# Patient Record
Sex: Female | Born: 1935 | Race: White | Hispanic: No | Marital: Married | Smoking: Former smoker
Health system: Southern US, Community
[De-identification: ages and names within clinical notes are randomized; demographics above are authoritative.]

## PROBLEM LIST (undated history)

## (undated) DIAGNOSIS — N189 Chronic kidney disease, unspecified: Secondary | ICD-10-CM

## (undated) DIAGNOSIS — K219 Gastro-esophageal reflux disease without esophagitis: Secondary | ICD-10-CM

## (undated) DIAGNOSIS — J449 Chronic obstructive pulmonary disease, unspecified: Secondary | ICD-10-CM

## (undated) DIAGNOSIS — D45 Polycythemia vera: Secondary | ICD-10-CM

## (undated) DIAGNOSIS — J45909 Unspecified asthma, uncomplicated: Secondary | ICD-10-CM

## (undated) DIAGNOSIS — I2699 Other pulmonary embolism without acute cor pulmonale: Secondary | ICD-10-CM

## (undated) DIAGNOSIS — J40 Bronchitis, not specified as acute or chronic: Secondary | ICD-10-CM

## (undated) DIAGNOSIS — M1990 Unspecified osteoarthritis, unspecified site: Secondary | ICD-10-CM

## (undated) DIAGNOSIS — C50919 Malignant neoplasm of unspecified site of unspecified female breast: Secondary | ICD-10-CM

## (undated) HISTORY — PX: BREAST LUMPECTOMY: SHX2

## (undated) HISTORY — PX: CHOLECYSTECTOMY: SHX55

## (undated) HISTORY — DX: Other pulmonary embolism without acute cor pulmonale: I26.99

## (undated) HISTORY — PX: TONSILLECTOMY: SUR1361

---

## 2004-05-24 ENCOUNTER — Ambulatory Visit: Payer: Self-pay | Admitting: Gastroenterology

## 2004-09-10 ENCOUNTER — Ambulatory Visit: Payer: Self-pay | Admitting: Gastroenterology

## 2006-07-02 ENCOUNTER — Ambulatory Visit: Payer: Self-pay | Admitting: Otolaryngology

## 2007-11-18 ENCOUNTER — Ambulatory Visit: Payer: Self-pay | Admitting: Gastroenterology

## 2015-06-24 ENCOUNTER — Other Ambulatory Visit: Payer: Self-pay

## 2015-06-24 ENCOUNTER — Emergency Department: Payer: Medicare (Managed Care)

## 2015-06-24 ENCOUNTER — Emergency Department
Admission: EM | Admit: 2015-06-24 | Discharge: 2015-06-24 | Disposition: A | Discharge status: Home / Self Care | Payer: Medicare (Managed Care) | Attending: Emergency Medicine | Admitting: Emergency Medicine

## 2015-06-24 ENCOUNTER — Encounter: Payer: Self-pay | Admitting: *Deleted

## 2015-06-24 DIAGNOSIS — J441 Chronic obstructive pulmonary disease with (acute) exacerbation: Secondary | ICD-10-CM

## 2015-06-24 DIAGNOSIS — Z87891 Personal history of nicotine dependence: Secondary | ICD-10-CM | POA: Diagnosis not present

## 2015-06-24 DIAGNOSIS — R05 Cough: Secondary | ICD-10-CM | POA: Diagnosis present

## 2015-06-24 DIAGNOSIS — Z880 Allergy status to penicillin: Secondary | ICD-10-CM | POA: Insufficient documentation

## 2015-06-24 HISTORY — DX: Malignant neoplasm of unspecified site of unspecified female breast: C50.919

## 2015-06-24 HISTORY — DX: Bronchitis, not specified as acute or chronic: J40

## 2015-06-24 HISTORY — DX: Unspecified asthma, uncomplicated: J45.909

## 2015-06-24 LAB — CBC WITH DIFFERENTIAL/PLATELET
BASOS ABS: 0.1 10*3/uL (ref 0–0.1)
BASOS PCT: 1 %
Eosinophils Absolute: 0.2 10*3/uL (ref 0–0.7)
Eosinophils Relative: 2 %
HEMATOCRIT: 45.7 % (ref 35.0–47.0)
HEMOGLOBIN: 14.8 g/dL (ref 12.0–16.0)
Lymphocytes Relative: 9 %
Lymphs Abs: 0.8 10*3/uL — ABNORMAL LOW (ref 1.0–3.6)
MCH: 27.9 pg (ref 26.0–34.0)
MCHC: 32.3 g/dL (ref 32.0–36.0)
MCV: 86.4 fL (ref 80.0–100.0)
MONOS PCT: 9 %
Monocytes Absolute: 0.8 10*3/uL (ref 0.2–0.9)
NEUTROS ABS: 6.7 10*3/uL — AB (ref 1.4–6.5)
NEUTROS PCT: 79 %
Platelets: 546 10*3/uL — ABNORMAL HIGH (ref 150–440)
RBC: 5.29 MIL/uL — ABNORMAL HIGH (ref 3.80–5.20)
RDW: 15.4 % — ABNORMAL HIGH (ref 11.5–14.5)
WBC: 8.5 10*3/uL (ref 3.6–11.0)

## 2015-06-24 LAB — BASIC METABOLIC PANEL
ANION GAP: 5 (ref 5–15)
BUN: 15 mg/dL (ref 6–20)
CO2: 28 mmol/L (ref 22–32)
Calcium: 9.8 mg/dL (ref 8.9–10.3)
Chloride: 108 mmol/L (ref 101–111)
Creatinine, Ser: 1.1 mg/dL — ABNORMAL HIGH (ref 0.44–1.00)
GFR calc Af Amer: 51 mL/min — ABNORMAL LOW (ref >60)
GFR, EST NON AFRICAN AMERICAN: 44 mL/min — AB (ref >60)
GLUCOSE: 101 mg/dL — AB (ref 65–99)
POTASSIUM: 4.2 mmol/L (ref 3.5–5.1)
Sodium: 141 mmol/L (ref 135–145)

## 2015-06-24 LAB — TROPONIN I: Troponin I: <0.03 ng/mL (ref <0.031)

## 2015-06-24 MED ORDER — ALBUTEROL SULFATE (2.5 MG/3ML) 0.083% IN NEBU
5.0000 mg | INHALATION_SOLUTION | Freq: Once | RESPIRATORY_TRACT | Status: AC
  Administered 2015-06-24: 5 mg via RESPIRATORY_TRACT
  Filled 2015-06-24: qty 6

## 2015-06-24 MED ORDER — PREDNISONE 10 MG PO TABS: ORAL_TABLET | ORAL | Status: DC

## 2015-06-24 MED ORDER — PREDNISONE 20 MG PO TABS
60.0000 mg | ORAL_TABLET | Freq: Once | ORAL | Status: AC
  Administered 2015-06-24: 60 mg via ORAL
  Filled 2015-06-24: qty 3

## 2015-06-24 MED ORDER — IPRATROPIUM-ALBUTEROL 0.5-2.5 (3) MG/3ML IN SOLN
3.0000 mL | Freq: Once | RESPIRATORY_TRACT | Status: AC
  Administered 2015-06-24: 3 mL via RESPIRATORY_TRACT
  Filled 2015-06-24: qty 3

## 2015-06-24 NOTE — ED Notes (Signed)
Pt ambulated with 02 stat monitored. Pt maintained an 02 stat between 90-94%.

## 2015-06-24 NOTE — ED Provider Notes (Signed)
[facility] Emergency Department Provider Note   ____________________________________________  Time seen:  I have reviewed the triage vital signs and the triage nursing note.  HISTORY  Chief Complaint Cough   Historian Patient, spouse and son  HPI Summer Green is a 81 y.o. female with history of asthma/COPD who uses albuterol and a nebulizer treatment at home in [place], and she is here visiting family for the Christmas holiday. She started having trouble breathing about 2 days ago, it was worse all night long.  She reports no history of hospitalization for COPD or wheezing. No fevers. No productive cough. Chest pressure but no chest pain. She doesn't wear home oxygen. Symptoms are moderate.    Past Medical History  Diagnosis Date  . Asthma   . Bronchitis   . Breast cancer ([place])     right side    There are no active problems to display for this patient.   Past Surgical History  Procedure Laterality Date  . Breast lumpectomy      right side    Current Outpatient Rx  Name  Route  Sig  Dispense  Refill  . predniSONE (DELTASONE) 10 MG tablet      50mg  daily for 4 more days   20 tablet   0     Allergies Amoxicillin; Codeine; Iodine; Morphine and related; and Shellfish allergy  History reviewed. No pertinent family history.  Social History Social History  Substance Use Topics  . Smoking status: Former Research scientist (life sciences)  . Smokeless tobacco: None  . Alcohol Use: No    Review of Systems  Constitutional: Negative for fever. Eyes: Negative for visual changes. ENT: Negative for sore throat. Cardiovascular: Negative for chest pain. Respiratory: positivefor shortness of breath. Gastrointestinal: Negative for abdominal pain, vomiting and diarrhea. Genitourinary: Negative for dysuria. Musculoskeletal: Negative for back pain. Skin: Negative for rash. Neurological: Negative for headache. 10 point Review of Systems otherwise  negative ____________________________________________   PHYSICAL EXAM:  VITAL SIGNS: ED Triage Vitals  Enc Vitals Group     BP 06/24/15 1611 154/90 mmHg     Pulse Rate 06/24/15 1611 90     Resp 06/24/15 1611 18     Temp 06/24/15 1611 98.3 F (36.8 C)     Temp Source 06/24/15 1611 Oral     SpO2 06/24/15 1611 96 %     Weight 06/24/15 1611 185 lb (83.915 kg)     Height 06/24/15 1611 5' 8.5" (1.74 m)     Head Cir --      Peak Flow --      Pain Score --      Pain Loc --      Pain Edu? --      Excl. in [place]? --      Constitutional: Alert and oriented. Well appearingoverall but actively wheezing.. Eyes: Conjunctivae are normal. PERRL. Normal extraocular movements. ENT   Head: Normocephalic and atraumatic.   Nose: No congestion/rhinnorhea.   Mouth/Throat: Mucous membranes are moist.   Neck: No stridor. Cardiovascular/Chest: Normal rate, regular rhythm.  No murmurs, rubs, or gallops. Respiratory: active wheezing in all lung fields with decreased air movement throughout. No rhonchi. Positive tachypnea. No retractions. Gastrointestinal: Soft. No distention, no guarding, no rebound. Nontender   Genitourinary/rectal:Deferred Musculoskeletal: Nontender with normal range of motion in all extremities. No joint effusions.  No lower extremity tenderness.  No edema. Neurologic:  Normal speech and language. No gross or focal neurologic deficits are appreciated. Skin:  Skin is warm, dry and  intact. No rash noted. Psychiatric: Mood and affect are normal. Speech and behavior are normal. Patient exhibits appropriate insight and judgment.  ____________________________________________   EKG I, Lisa Roca, MD, the attending physician have personally viewed and interpreted all ECGs.  94 bpm. Narrow QRS. Left axis deviation. Nonspecific ST segment ____________________________________________  LABS (pertinent positives/negatives)  Basic metabolic panel without significant  abnormality. Creatinine 1.10 Troponin less than 0.03 White blood count 8.5, hemoglobin14.8 and platelet count 546  ____________________________________________  RADIOLOGY All Xrays were viewed by me. Imaging interpreted by Radiologist.  Chest x-ray two-view: Hyperinflated lungs with chronic bronchitic markings. No acute findings. __________________________________________  PROCEDURES  Procedure(s) performed: None  Critical Care performed: None  ____________________________________________   ED COURSE / ASSESSMENT AND PLAN  CONSULTATIONS: None  Pertinent labs & imaging results that were available during my care of the patient were reviewed by me and considered in my medical decision making (see chart for details).   Patient in mild respiratory distress due to wheezing. Patient received initially 1 DuoNeb, followed by 2 to. After this her lungs were clear and without wheezing. She felt much better. She was given first dose of prednisone 60 mg here. Patient was walked after breathing treatments and her oxygen saturation remained above 90-94% on room air and she was able to walk tolerating this well. I discussed with her abdomen to add a burst treatment of prednisone for 4 more days, and she has her albuterol inhaler and we discussed how to use it.  Heart rate at rest around 90, and when she stands and walks around 110. She's a symptomatic with this, and I suspected due to the albuterol. I discussed this with the patient, and don't think this is any indication of worsening condition.  Patient / Family / Caregiver informed of clinical course, medical decision-making process, and agree with plan.   I discussed return precautions, follow-up instructions, and discharged instructions with patient and/or family.  ___________________________________________   FINAL CLINICAL IMPRESSION(S) / ED DIAGNOSES   Final diagnoses:  COPD exacerbation ([place])       Lisa Roca,  MD 06/24/15 2107

## 2015-06-24 NOTE — ED Notes (Signed)
Patient is resting comfortably. 

## 2015-06-24 NOTE — Discharge Instructions (Signed)
You were treated for wheezing and COPD exacerbation. You're being started on prednisone for 4 more days.  Use her inhaler 2 puffs every 4 hours while awake for the next 2 days during your wheezing exacerbation. Return to the emergency department any worsening condition including trouble breathing, wheezing, chest pain, altered mental status, or fever. Return for any symptoms that are concerning to you.   Chronic Obstructive Pulmonary Disease Exacerbation Chronic obstructive pulmonary disease (COPD) is a common lung problem. In COPD, the flow of air from the lungs is limited. COPD exacerbations are times that breathing gets worse and you need extra treatment. Without treatment they can be life threatening. If they happen often, your lungs can become more damaged. If your COPD gets worse, your doctor may treat you with:  Medicines.  Oxygen.  Different ways to clear your airway, such as using a mask. HOME CARE  Do not smoke.  Avoid tobacco smoke and other things that bother your lungs.  If given, take your antibiotic medicine as told. Finish the medicine even if you start to feel better.  Only take medicines as told by your doctor.  Drink enough fluids to keep your pee (urine) clear or pale yellow (unless your doctor has told you not to).  Use a cool mist machine (vaporizer).  If you use oxygen or a machine that turns liquid medicine into a mist (nebulizer), continue to use them as told.  Keep up with shots (vaccinations) as told by your doctor.  Exercise regularly.  Eat healthy foods.  Keep all doctor visits as told. GET HELP RIGHT AWAY IF:  You are very short of breath and it gets worse.  You have trouble talking.  You have bad chest pain.  You have blood in your spit (sputum).  You have a fever.  You keep throwing up (vomiting).  You feel weak, or you pass out (faint).  You feel confused.  You keep getting worse. MAKE SURE YOU:  Understand these  instructions.  Will watch your condition.  Will get help right away if you are not doing well or get worse.   This information is not intended to replace advice given to you by your health care provider. Make sure you discuss any questions you have with your health care provider.   Document Released: 06/05/2011 Document Revised: 07/07/2014 Document Reviewed: 02/18/2013 Elsevier Interactive Patient Education [employer].

## 2015-06-24 NOTE — ED Notes (Signed)
Pt discharged to home with family.  Discharged teaching done.  Pt voiced understanding.  No questions or concerns at this time.  Teach back verified.  Pt in NAD.  No items left in ED.

## 2015-06-24 NOTE — ED Notes (Signed)
Pt reports having SOB and cough for the past couple days but coughing became worse last night. PT arrives to ED SOB with exp wheezing.

## 2020-09-04 NOTE — Progress Notes (Signed)
[facility]  [address] [place], [place] [address] Phone: [phone]  Fax: [phone]   Clinic Day:  09/05/2020  Referring physician: Ginette Otto Malena Edman*  Chief Complaint: Summer Green is a 87 y.o. female with polycythemia rubra vera (PV) who is referred in consultation by Tillman Sers, NP for assessment and management.   HPI:  The patient was diagnosed with polycythemia rubra vera in 2018.  JAK2 mutation assay was positive for V617F mutation on 09/15/2016.  Bone marrow on 11/03/2016 showed first regular bone marrow with panmyelosis and some lymphoid aggregates. Special stains were not diagnostic of lymphoproliferative disease morphologically. Flow cytometry showed small population (1.2%) of monoclonal B cells.   She undergoes phlebotomies every 1-2 weeks as needed to maintain a hematocrit < 45. Her last phlebotomy was about 6 months ago. She was started on hydroxyurea in 2018; she currently takes 500 mg every Monday, Tuesday, Thursday, and Friday. She used to take it everyday.  The patient is moving to the area from [place] and was referred for continuing care.  Labs followed: 11/23/2018:  Hematocrit was 43.0, hemoglobin 13,7, MCV 93.9, platelets 484,000, WBC 7,000. 02/15/2019:  Hematocrit was 42.7, hemoglobin 13.3, MCV 98.2, platelets 448,000, WBC 6,260. 05/10/2019:  Hematocrit was 42.4, hemoglobin 13.5, MCV 94.8, platelets 459,000, WBC 6,280. 08/10/2019:  Hematocrit was 42.2, hemoglobin 13.5, MCV 96.1, platelets 485,000, WBC 5,990. 11/16/2019:  Hematocrit was 41.1, hemoglobin 12.0, MCV 97.6, platelets 430,000, WBC 6,420.  She was diagnosed with stage IA right breast cancer in 10/2010 (ER+, PR-, HER2-). She underwent right lumpectomy and lymph node biopsy on 11/08/2010. She completed radiation in 01/2011. She received anastrozole from 01/2011 - 07/2016. Diagnostic mammogram on 04/19/2020 showed a subcentimeter benign-appearing intramammary  lymph node at the left breast, 2:00 position, 12 cm from the nipple. There was no evidence of malignancy.  She had a pulmonary embolism in 07/2016. She was treated with IV heparin x 3 days. Factor V Leiden and prothrombin gene mutation were negative on 09/12/2016.  She was on Pradaxa 150 mg BID (08/20/2016 - 02/20/2017) then Xarelto (02/21/2017 - 07/2017). Xarelto was discontinued due to a diverticular bleed. It was felt that her PE may have been provoked by anastrozole.  The patient has osteopenia. She has stage 3 chronic kidney disease.  Symptomatically, she has been good. She has lost 15-20 lbs in the past 2 years. She ate less due to the pandemic. She has shortness of breath due to allergies. She has occasional constipation. Her skin is dry. She sometimes has numbness in her fingers and toes. She has a cough.  The patient denies fevers, sweats, headaches, changes in vision, runny nose, sore throat, chest pain, palpitations, nausea, vomiting, diarrhea, reflux, urinary symptoms, bone or joint symptoms, skin changes, numbness, weakness, balance or coordination problem, and bleeding of any kind.  She takes baby aspirin daily.   Past Medical History:  Diagnosis Date  . Asthma   . Breast cancer ([place])    right side  . Bronchitis   . Pulmonary embolism [facility])     Past Surgical History:  Procedure Laterality Date  . BREAST LUMPECTOMY     right side  . CHOLECYSTECTOMY    . TONSILLECTOMY      Family History  Problem Relation Age of Onset  . Diabetes Mother   . Heart disease Father     Social History:  reports that she has quit smoking. She has never used smokeless tobacco. She reports that she does not drink  alcohol and does not use drugs. She quit smoking 50+ years ago. She smoked 1 pack per day. She does not drink alcohol. She is retired and used to run a business. She previously lived in [place] and has been in [place] since 05/2020. She and her husband live with her son and daughter in  law. Her daughter in law is Santiago Glad. The patient is accompanied by Santiago Glad today.  Allergies:  Allergies  Allergen Reactions  . Amoxicillin   . Codeine   . Iodine   . Morphine And Related   . Shellfish Allergy     Current Medications: Current Outpatient Medications  Medication Sig Dispense Refill  . albuterol (VENTOLIN HFA) 108 (90 Base) MCG/ACT inhaler Inhale into the lungs.    Marland Kitchen aspirin 81 MG EC tablet Take by mouth.    . cyanocobalamin 1000 MCG tablet Take by mouth.    . hydroxyurea (HYDREA) 500 MG capsule Take by mouth.    . hydroxyurea (HYDREA) 500 MG capsule Take 1 tablet by mouth on Monday, Tuesday, Thursday, Friday every week. May take with food to minimize GI side effects. 50 capsule 0  . ipratropium-albuterol (DUONEB) 0.5-2.5 (3) MG/3ML SOLN Inhale into the lungs.    . Magnesium 250 MG TABS Take by mouth.    . OMEPRAZOLE PO Take by mouth.     No current facility-administered medications for this visit.    Review of Systems  Constitutional: Negative for chills, diaphoresis, fever, malaise/fatigue and weight loss.  HENT: Positive for hearing loss. Negative for congestion, ear discharge, ear pain, nosebleeds, sinus pain, sore throat and tinnitus.   Eyes: Negative for blurred vision.  Respiratory: Positive for cough and shortness of breath (due to allergies). Negative for hemoptysis and sputum production.   Cardiovascular: Negative for chest pain, palpitations and leg swelling.  Gastrointestinal: Positive for constipation (occasional). Negative for abdominal pain, blood in stool, diarrhea, heartburn, melena, nausea and vomiting.  Genitourinary: Negative for dysuria, frequency, hematuria and urgency.  Musculoskeletal: Negative for back pain, joint pain, myalgias and neck pain.  Skin: Negative for itching and rash.       Dry skin  Neurological: Positive for sensory change (numbness in fingers and toes sometimes). Negative for dizziness, tingling, weakness and headaches.   Endo/Heme/Allergies: Bruises/bleeds easily.  Psychiatric/Behavioral: Negative for depression and memory loss. The patient is not nervous/anxious and does not have insomnia.   All other systems reviewed and are negative.  Performance status (ECOG): 1  Vitals Blood pressure 129/75, pulse 80, temperature (!) 97.1 F (36.2 C), temperature source Tympanic, resp. rate 18, weight 156 lb 8.4 oz (71 kg), SpO2 98 %.   Physical Exam Vitals and nursing note reviewed.  Constitutional:      General: She is not in acute distress.    Appearance: She is not diaphoretic.  HENT:     Head: Normocephalic and atraumatic.     Comments: Styled white hair.    Mouth/Throat:     Mouth: Mucous membranes are moist.     Pharynx: Oropharynx is clear.  Eyes:     General: No scleral icterus.    Extraocular Movements: Extraocular movements intact.     Conjunctiva/sclera: Conjunctivae normal.     Pupils: Pupils are equal, round, and reactive to light.  Cardiovascular:     Rate and Rhythm: Normal rate and regular rhythm.     Heart sounds: Normal heart sounds. No murmur heard.   Pulmonary:     Effort: Pulmonary effort is normal. No respiratory distress.  Breath sounds: Normal breath sounds. No wheezing or rales.  Chest:     Chest wall: No tenderness.  Breasts:     Right: No axillary adenopathy or supraclavicular adenopathy.     Left: No axillary adenopathy or supraclavicular adenopathy.    Abdominal:     General: Bowel sounds are normal. There is no distension.     Palpations: Abdomen is soft. There is no hepatomegaly, splenomegaly or mass.     Tenderness: There is no abdominal tenderness. There is no guarding or rebound.  Musculoskeletal:        General: No swelling or tenderness. Normal range of motion.     Cervical back: Normal range of motion and neck supple.  Lymphadenopathy:     Head:     Right side of head: No preauricular, posterior auricular or occipital adenopathy.     Left side of head:  No preauricular, posterior auricular or occipital adenopathy.     Cervical: No cervical adenopathy.     Upper Body:     Right upper body: No supraclavicular or axillary adenopathy.     Left upper body: No supraclavicular or axillary adenopathy.     Lower Body: No right inguinal adenopathy. No left inguinal adenopathy.  Skin:    General: Skin is warm and dry.  Neurological:     Mental Status: She is alert and oriented to person, place, and time.  Psychiatric:        Behavior: Behavior normal.        Thought Content: Thought content normal.        Judgment: Judgment normal.    No visits with results within 3 Day(s) from this visit.  Latest known visit with results is:  Admission on 06/24/2015, Discharged on 06/24/2015  Component Date Value Ref Range Status  . Sodium 06/24/2015 141  135 - 145 mmol/L Final  . Potassium 06/24/2015 4.2  3.5 - 5.1 mmol/L Final  . Chloride 06/24/2015 108  101 - 111 mmol/L Final  . CO2 06/24/2015 28  22 - 32 mmol/L Final  . Glucose, Bld 06/24/2015 101* 65 - 99 mg/dL Final  . BUN 06/24/2015 15  6 - 20 mg/dL Final  . Creatinine, Ser 06/24/2015 1.10* 0.44 - 1.00 mg/dL Final  . Calcium 06/24/2015 9.8  8.9 - 10.3 mg/dL Final  . GFR calc non Af Amer 06/24/2015 44* >60 mL/min Final  . GFR calc Af Amer 06/24/2015 51* >60 mL/min Final   Comment: (NOTE) The eGFR has been calculated using the CKD EPI equation. This calculation has not been validated in all clinical situations. eGFR's persistently <60 mL/min signify possible Chronic Kidney Disease.   . Anion gap 06/24/2015 5  5 - 15 Final  . Troponin I 06/24/2015 <0.03  <0.031 ng/mL Final   Comment:        NO INDICATION OF MYOCARDIAL INJURY.   . WBC 06/24/2015 8.5  3.6 - 11.0 K/uL Final  . RBC 06/24/2015 5.29* 3.80 - 5.20 MIL/uL Final  . Hemoglobin 06/24/2015 14.8  12.0 - 16.0 g/dL Final  . HCT 06/24/2015 45.7  35.0 - 47.0 % Final  . MCV 06/24/2015 86.4  80.0 - 100.0 fL Final  . MCH 06/24/2015 27.9  26.0 -  34.0 pg Final  . MCHC 06/24/2015 32.3  32.0 - 36.0 g/dL Final  . RDW 06/24/2015 15.4* 11.5 - 14.5 % Final  . Platelets 06/24/2015 546* 150 - 440 K/uL Final  . Neutrophils Relative % 06/24/2015 79  % Final  .  Neutro Abs 06/24/2015 6.7* 1.4 - 6.5 K/uL Final  . Lymphocytes Relative 06/24/2015 9  % Final  . Lymphs Abs 06/24/2015 0.8* 1.0 - 3.6 K/uL Final  . Monocytes Relative 06/24/2015 9  % Final  . Monocytes Absolute 06/24/2015 0.8  0.2 - 0.9 K/uL Final  . Eosinophils Relative 06/24/2015 2  % Final  . Eosinophils Absolute 06/24/2015 0.2  0 - 0.7 K/uL Final  . Basophils Relative 06/24/2015 1  % Final  . Basophils Absolute 06/24/2015 0.1  0 - 0.1 K/uL Final    Assessment:  Summer Green is a 87 y.o. female with polycythemia rubra vera.  JAK2 revealed V617F mutation on 09/15/2016. Factor V Leiden and prothrombin gene mutation were negative on 09/12/2016. Bone marrow on 11/03/2016 showed first regular bone marrow with panmyelosis and some lymphoid aggregates. Special stains were not diagnostic of lymphoproliferative disease morphologically. Flow cytometry showed small population (1.2%) of monoclonal B cells.   She is on a phlebotomy program. Hematocrit goal is < 45. She began hydroxyurea in 2018.  Current dose is 500 mg every Monday, Tuesday, Thursday, and Friday.   She has a history of stage IA right breast cancer in 10/2010 (ER+, PR-, HER2-). She underwent right lumpectomy and lymph node biopsy on 11/08/2010. She complete radiation in 01/2011. She received anastrozole from 01/2011 - 07/2016. Diagnostic mammogram on 04/19/2020 showed a subcentimeter benign-appearing intramammary lymph node at the left breast, 2:00 position, 12 cm from the nipple. There was no evidence of malignancy.  She had a pulmonary embolism in 07/2016 felt secondary to anastrazole.  Factor V Leiden and prothrombin gene mutation were negative on 09/12/2016.  She was on Pradaxa (08/20/2016 - 02/20/2017) then Xarelto (02/21/2017  - 07/2017). Xarelto was discontinued due to a diverticular bleed.  She has osteopenia. She has stage 3 chronic kidney disease.  Symptomatically, she feels good. She has lost 15-20 lbs in the past 2 years due to the pandemic.  Exam reveals no adenopathy or hepatosplenomegaly.  Hematocrit is 39.6, hemoglobin 12.8, platelets 444,000, WBC 5100 with an ANC of 3600.  Plan: 1.   Labs today:  CBC with diff, CMP, ferritin, iron studies. 2.   Polycythemia rubra vera  Review entire medical history, diagnoses and management of PV.   She is currently on hydroxyurea 500 mg every Monday, Tuesday, Thursday and Friday.  Hematocrit goal is<= 42.  Platelet goal is < 400,000.  She is on a baby aspirin. 3.   Stage IA right breast cancer  Review entire medical history, diagnoses and management of stage I ER+ breast cancer.  Continue yearly surveillance in 03/2021. 4.   History of pulmonary embolism  Pulmonary embolism felt secondary to anastrozole.  She was on Pradaxa then Xarelto.  Anticoagulation was discontinued in 07/2017 secondary to a diverticular bleed. 5.   RTC in 3 months for MD assessment and labs (CBC with diff, CMP).  I discussed the assessment and treatment plan with the patient.  The patient was provided an opportunity to ask questions and all were answered.  The patient agreed with the plan and demonstrated an understanding of the instructions.  The patient was advised to call back if the symptoms worsen or if the condition fails to improve as anticipated.  I provided 28 minutes of face-to-face time during this this encounter and > 50% was spent counseling as documented under my assessment and plan. An additional 20 minutes were spent reviewing her chart (Epic and Care Everywhere) including notes, labs, and imaging studies.  Summer Green C. Mike Gip, MD, PhD    09/05/2020, 3:45 PM  I, Mirian Mo Tufford, am acting as Education administrator for [employer]. Mike Gip, MD, PhD.  I, Kavaughn Faucett C. Mike Gip, MD, have reviewed  the above documentation for accuracy and completeness, and I agree with the above.

## 2020-09-05 ENCOUNTER — Other Ambulatory Visit: Payer: Self-pay

## 2020-09-05 ENCOUNTER — Encounter: Payer: Self-pay | Admitting: Hematology and Oncology

## 2020-09-05 ENCOUNTER — Inpatient Hospital Stay: Payer: Medicare Other

## 2020-09-05 ENCOUNTER — Inpatient Hospital Stay: Payer: Medicare Other | Attending: Hematology and Oncology | Admitting: Hematology and Oncology

## 2020-09-05 VITALS — BP 129/75 | HR 80 | Temp 97.1°F | Resp 18 | Wt 156.5 lb

## 2020-09-05 DIAGNOSIS — D45 Polycythemia vera: Secondary | ICD-10-CM | POA: Insufficient documentation

## 2020-09-05 DIAGNOSIS — N183 Chronic kidney disease, stage 3 unspecified: Secondary | ICD-10-CM | POA: Diagnosis not present

## 2020-09-05 DIAGNOSIS — Z79811 Long term (current) use of aromatase inhibitors: Secondary | ICD-10-CM

## 2020-09-05 DIAGNOSIS — M8580 Other specified disorders of bone density and structure, unspecified site: Secondary | ICD-10-CM | POA: Insufficient documentation

## 2020-09-05 DIAGNOSIS — Z7982 Long term (current) use of aspirin: Secondary | ICD-10-CM | POA: Diagnosis not present

## 2020-09-05 DIAGNOSIS — Z853 Personal history of malignant neoplasm of breast: Secondary | ICD-10-CM | POA: Insufficient documentation

## 2020-09-05 LAB — COMPREHENSIVE METABOLIC PANEL
ALT: 14 U/L (ref 0–44)
AST: 20 U/L (ref 15–41)
Albumin: 3.8 g/dL (ref 3.5–5.0)
Alkaline Phosphatase: 81 U/L (ref 38–126)
Anion gap: 9 (ref 5–15)
BUN: 23 mg/dL (ref 8–23)
CO2: 26 mmol/L (ref 22–32)
Calcium: 9.9 mg/dL (ref 8.9–10.3)
Chloride: 105 mmol/L (ref 98–111)
Creatinine, Ser: 1.2 mg/dL — ABNORMAL HIGH (ref 0.44–1.00)
GFR, Estimated: 43 mL/min — ABNORMAL LOW (ref >60)
Glucose, Bld: 137 mg/dL — ABNORMAL HIGH (ref 70–99)
Potassium: 4.2 mmol/L (ref 3.5–5.1)
Sodium: 140 mmol/L (ref 135–145)
Total Bilirubin: 0.4 mg/dL (ref 0.3–1.2)
Total Protein: 6.6 g/dL (ref 6.5–8.1)

## 2020-09-05 LAB — CBC WITH DIFFERENTIAL/PLATELET
Abs Immature Granulocytes: 0.01 10*3/uL (ref 0.00–0.07)
Basophils Absolute: 0 10*3/uL (ref 0.0–0.1)
Basophils Relative: 1 %
Eosinophils Absolute: 0.1 10*3/uL (ref 0.0–0.5)
Eosinophils Relative: 3 %
HCT: 39.6 % (ref 36.0–46.0)
Hemoglobin: 12.8 g/dL (ref 12.0–15.0)
Immature Granulocytes: 0 %
Lymphocytes Relative: 20 %
Lymphs Abs: 1 10*3/uL (ref 0.7–4.0)
MCH: 31.8 pg (ref 26.0–34.0)
MCHC: 32.3 g/dL (ref 30.0–36.0)
MCV: 98.3 fL (ref 80.0–100.0)
Monocytes Absolute: 0.3 10*3/uL (ref 0.1–1.0)
Monocytes Relative: 7 %
Neutro Abs: 3.6 10*3/uL (ref 1.7–7.7)
Neutrophils Relative %: 69 %
Platelets: 444 10*3/uL — ABNORMAL HIGH (ref 150–400)
RBC: 4.03 MIL/uL (ref 3.87–5.11)
RDW: 14.4 % (ref 11.5–15.5)
WBC: 5.1 10*3/uL (ref 4.0–10.5)
nRBC: 0 % (ref 0.0–0.2)

## 2020-09-05 LAB — FERRITIN: Ferritin: 43 ng/mL (ref 11–307)

## 2020-09-05 LAB — IRON AND TIBC
Iron: 51 ug/dL (ref 28–170)
Saturation Ratios: 17 % (ref 10.4–31.8)
TIBC: 307 ug/dL (ref 250–450)
UIBC: 256 ug/dL

## 2020-09-05 MED ORDER — HYDROXYUREA 500 MG PO CAPS: ORAL_CAPSULE | ORAL | 0 refills | Status: DC

## 2020-09-05 NOTE — Progress Notes (Signed)
Patient reports chronic cough, asthma, numbness and tingling in fingers and toes

## 2020-09-06 ENCOUNTER — Telehealth: Payer: Self-pay

## 2020-09-06 NOTE — Telephone Encounter (Signed)
-----   Message from Lequita Asal, MD sent at 09/06/2020  1:18 PM EST ----- Regarding: Please call the patient's daughter  Baxter Flattery,   Review CBC.  Platelet count > 400,000.  Let's check a CBC with diff in 6 weeks.  If platelet count remains > 400,000 in 6 weeks, we will adjust hydroxyurea at that time.   M   ----- Message ----- From: Buel Ream, Lab In [place] Sent: 09/05/2020   3:55 PM EST To: Lequita Asal, MD

## 2020-10-04 DIAGNOSIS — Z853 Personal history of malignant neoplasm of breast: Secondary | ICD-10-CM | POA: Insufficient documentation

## 2020-10-04 DIAGNOSIS — E538 Deficiency of other specified B group vitamins: Secondary | ICD-10-CM | POA: Insufficient documentation

## 2020-10-04 DIAGNOSIS — M8580 Other specified disorders of bone density and structure, unspecified site: Secondary | ICD-10-CM | POA: Insufficient documentation

## 2020-10-04 DIAGNOSIS — N183 Chronic kidney disease, stage 3 unspecified: Secondary | ICD-10-CM | POA: Insufficient documentation

## 2020-10-04 DIAGNOSIS — Z91018 Allergy to other foods: Secondary | ICD-10-CM | POA: Insufficient documentation

## 2020-10-04 DIAGNOSIS — J302 Other seasonal allergic rhinitis: Secondary | ICD-10-CM | POA: Insufficient documentation

## 2020-10-04 DIAGNOSIS — K219 Gastro-esophageal reflux disease without esophagitis: Secondary | ICD-10-CM | POA: Insufficient documentation

## 2020-10-04 DIAGNOSIS — E559 Vitamin D deficiency, unspecified: Secondary | ICD-10-CM | POA: Insufficient documentation

## 2020-10-04 DIAGNOSIS — J4540 Moderate persistent asthma, uncomplicated: Secondary | ICD-10-CM | POA: Insufficient documentation

## 2020-10-18 ENCOUNTER — Other Ambulatory Visit: Payer: Self-pay

## 2020-10-18 ENCOUNTER — Inpatient Hospital Stay: Payer: Medicare Other

## 2020-10-18 ENCOUNTER — Inpatient Hospital Stay: Payer: Medicare Other | Attending: Hematology and Oncology

## 2020-10-18 DIAGNOSIS — D45 Polycythemia vera: Secondary | ICD-10-CM | POA: Insufficient documentation

## 2020-10-18 LAB — CBC WITH DIFFERENTIAL/PLATELET
Abs Immature Granulocytes: 0.01 10*3/uL (ref 0.00–0.07)
Basophils Absolute: 0.1 10*3/uL (ref 0.0–0.1)
Basophils Relative: 1 %
Eosinophils Absolute: 0.2 10*3/uL (ref 0.0–0.5)
Eosinophils Relative: 3 %
HCT: 38.8 % (ref 36.0–46.0)
Hemoglobin: 12.3 g/dL (ref 12.0–15.0)
Immature Granulocytes: 0 %
Lymphocytes Relative: 23 %
Lymphs Abs: 1.2 10*3/uL (ref 0.7–4.0)
MCH: 31.2 pg (ref 26.0–34.0)
MCHC: 31.7 g/dL (ref 30.0–36.0)
MCV: 98.5 fL (ref 80.0–100.0)
Monocytes Absolute: 0.4 10*3/uL (ref 0.1–1.0)
Monocytes Relative: 7 %
Neutro Abs: 3.4 10*3/uL (ref 1.7–7.7)
Neutrophils Relative %: 66 %
Platelets: 447 10*3/uL — ABNORMAL HIGH (ref 150–400)
RBC: 3.94 MIL/uL (ref 3.87–5.11)
RDW: 14.5 % (ref 11.5–15.5)
WBC: 5.1 10*3/uL (ref 4.0–10.5)
nRBC: 0 % (ref 0.0–0.2)

## 2020-10-21 ENCOUNTER — Other Ambulatory Visit: Payer: Self-pay

## 2020-10-21 ENCOUNTER — Encounter: Payer: Self-pay | Admitting: Emergency Medicine

## 2020-10-21 ENCOUNTER — Ambulatory Visit (INDEPENDENT_AMBULATORY_CARE_PROVIDER_SITE_OTHER): Payer: Medicare Other

## 2020-10-21 ENCOUNTER — Ambulatory Visit
Admission: EM | Admit: 2020-10-21 | Discharge: 2020-10-21 | Disposition: A | Discharge status: Home / Self Care | Payer: Medicare Other | Attending: Emergency Medicine | Admitting: Emergency Medicine

## 2020-10-21 DIAGNOSIS — R519 Headache, unspecified: Secondary | ICD-10-CM | POA: Diagnosis not present

## 2020-10-21 DIAGNOSIS — Z880 Allergy status to penicillin: Secondary | ICD-10-CM | POA: Diagnosis not present

## 2020-10-21 DIAGNOSIS — R0981 Nasal congestion: Secondary | ICD-10-CM | POA: Diagnosis not present

## 2020-10-21 DIAGNOSIS — R509 Fever, unspecified: Secondary | ICD-10-CM

## 2020-10-21 DIAGNOSIS — R059 Cough, unspecified: Secondary | ICD-10-CM | POA: Diagnosis present

## 2020-10-21 DIAGNOSIS — Z20822 Contact with and (suspected) exposure to covid-19: Secondary | ICD-10-CM | POA: Diagnosis not present

## 2020-10-21 DIAGNOSIS — Z87891 Personal history of nicotine dependence: Secondary | ICD-10-CM | POA: Diagnosis not present

## 2020-10-21 DIAGNOSIS — J069 Acute upper respiratory infection, unspecified: Secondary | ICD-10-CM | POA: Insufficient documentation

## 2020-10-21 MED ORDER — BENZONATATE 100 MG PO CAPS: 200.0000 mg | ORAL_CAPSULE | Freq: Three times a day (TID) | ORAL | 0 refills | Status: DC

## 2020-10-21 MED ORDER — MOLNUPIRAVIR EUA 200MG CAPSULE: 4.0000 | ORAL_CAPSULE | Freq: Two times a day (BID) | ORAL | 0 refills | Status: AC

## 2020-10-21 NOTE — ED Triage Notes (Signed)
Patient c/o cough, congestion, fever, runny nose, and headache for the past 4 days.

## 2020-10-21 NOTE — Discharge Instructions (Addendum)
Quarantine until you have reached 5 days of symptoms and then at that time you can break quarantine if your symptoms have improved and you have not had a fever for 24 hours a taken Tylenol and ibuprofen.  Take over-the-counter Tylenol as needed for fever.  Use your albuterol inhaler as needed for wheezing and cough.  Use the Tessalon Perles every 8 hours as needed for cough.  Take them with a small sip of water.  Take the molnupiravir 4 capsules twice daily for the next 5 days.  If you develop any shortness of breath, especially at rest, you cannot catch her breath, you cannot speak in full sentences, or as a late sign your lips are turning blue you need to go to the ER for evaluation.

## 2020-10-21 NOTE — ED Provider Notes (Signed)
[facility]    CSN: [id number] Arrival date & time: 10/21/20  1427      History   Chief Complaint Chief Complaint  Patient presents with  . Cough  . Fever    HPI Nuriyah Hanline is a 87 y.o. female.   HPI   87 year old female here for evaluation of cough, nasal congestion, runny nose, fever, and headache.  Patient reports that her symptoms started 4 days ago and she is here with her husband who has similar symptoms and her daughter.  Patient's stated T-max was 99.8, her nasal discharge is clear, her cough is nonproductive, she has had some wheezing, and diarrhea.  Patient has not had any nausea or vomiting and no body aches.  Patient does have a history of asthma.  Past Medical History:  Diagnosis Date  . Asthma   . Breast cancer ([place])    right side  . Bronchitis   . Pulmonary embolism [facility])     Patient Active Problem List   Diagnosis Date Noted  . 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 BREAST LUMPECTOMY     right side  . CHOLECYSTECTOMY    . TONSILLECTOMY      OB History   No obstetric history on file.      Home Medications    Prior to Admission medications   Medication Sig Start Date End Date Taking? Authorizing Provider  albuterol (VENTOLIN HFA) 108 (90 Base) MCG/ACT inhaler Inhale into the lungs. 08/22/20 11/20/20 Yes [provider]  aspirin 81 MG EC tablet Take by mouth.   Yes [provider]  benzonatate (TESSALON) 100 MG capsule Take 2 capsules (200 mg total) by mouth every 8 (eight) hours. 10/21/20  Yes Margarette Canada, NP  cyanocobalamin 1000 MCG tablet Take by mouth.   Yes [provider]  hydroxyurea (HYDREA) 500 MG capsule Take by mouth. 08/07/20  Yes [provider]  hydroxyurea (HYDREA) 500 MG capsule Take 1 tablet by mouth on Monday, Tuesday, Thursday, Friday every week. May take with food to minimize GI side effects. 09/05/20  Yes Corcoran, Drue Second, MD  loratadine (CLARITIN) 10 MG tablet Take 10 mg by mouth daily.   Yes [provider]  Magnesium 250 MG TABS Take by mouth.   Yes [provider]  molnupiravir EUA 200 mg CAPS Take 4 capsules (800 mg total) by mouth 2 (two) times daily for 5 days. 10/21/20 10/26/20 Yes Margarette Canada, NP  OMEPRAZOLE PO Take by mouth.   Yes [provider]  ipratropium-albuterol (DUONEB) 0.5-2.5 (3) MG/3ML SOLN Inhale into the lungs.    [provider]    Family History Family History  Problem Relation Age of Onset  . Diabetes Mother   . Heart disease Father     Social History Social History   Tobacco Use  . Smoking status: Former Research scientist (life sciences)  . Smokeless tobacco: Never Used  Vaping Use  . Vaping Use: Never used  Substance Use Topics  . Alcohol use: No  . Drug use: No     Allergies   Amoxicillin, Codeine, Iodine, Morphine and related, and Shellfish allergy   Review of Systems Review of Systems  Constitutional: Positive for fever. Negative for activity change and appetite change.  HENT: Positive for congestion and rhinorrhea. Negative for sore throat.   Respiratory: Positive for cough and wheezing.   Gastrointestinal: Positive for diarrhea. Negative for nausea.  Musculoskeletal: Negative for arthralgias and  myalgias.  Skin: Negative for rash.  Neurological: Positive for headaches.  Hematological: Negative.   Psychiatric/Behavioral: Negative.      Physical Exam Triage Vital Signs ED Triage Vitals  Enc Vitals Group     BP 10/21/20 1507 114/68     Pulse Rate 10/21/20 1507 74     Resp 10/21/20 1507 14     Temp 10/21/20 1507 98.7 F (37.1 C)     Temp Source 10/21/20 1507 Oral     SpO2 10/21/20 1507 95 %     Weight 10/21/20 1502 153 lb (69.4 kg)     Height 10/21/20 1502 5\' 6"  (1.676 m)     Head Circumference --      Peak Flow --      Pain Score 10/21/20 1502 0     Pain Loc --      Pain Edu? --      Excl. in [place]? --    No data found.  Updated  Vital Signs BP 114/68 (BP Location: Right Arm)   Pulse 74   Temp 98.7 F (37.1 C) (Oral)   Resp 14   Ht 5\' 6"  (1.676 m)   Wt 153 lb (69.4 kg)   SpO2 95%   BMI 24.69 kg/m   Visual Acuity Right Eye Distance:   Left Eye Distance:   Bilateral Distance:    Right Eye Near:   Left Eye Near:    Bilateral Near:     Physical Exam Vitals and nursing note reviewed.  Constitutional:      General: She is not in acute distress.    Appearance: Normal appearance. She is normal weight. She is not ill-appearing.  HENT:     Head: Normocephalic and atraumatic.     Right Ear: Tympanic membrane, ear canal and external ear normal.     Left Ear: Tympanic membrane, ear canal and external ear normal.     Nose: Congestion and rhinorrhea present.     Mouth/Throat:     Mouth: Mucous membranes are moist.     Pharynx: Oropharynx is clear. Posterior oropharyngeal erythema present.  Cardiovascular:     Rate and Rhythm: Normal rate and regular rhythm.     Pulses: Normal pulses.     Heart sounds: Normal heart sounds. No murmur heard. No gallop.   Pulmonary:     Effort: Pulmonary effort is normal.     Breath sounds: Wheezing present. No rhonchi or rales.  Musculoskeletal:     Cervical back: Normal range of motion and neck supple.  Lymphadenopathy:     Cervical: No cervical adenopathy.  Skin:    General: Skin is warm and dry.     Capillary Refill: Capillary refill takes less than 2 seconds.     Findings: No erythema.  Neurological:     General: No focal deficit present.     Mental Status: She is alert and oriented to person, place, and time.  Psychiatric:        Mood and Affect: Mood normal.        Behavior: Behavior normal.        Thought Content: Thought content normal.        Judgment: Judgment normal.      UC Treatments / Results  Labs (all labs ordered are listed, but only abnormal results are displayed) Labs Reviewed  SARS CORONAVIRUS 2 (TAT 6-24 HRS)    EKG   Radiology DG  Chest 2 View  Result Date: 10/21/2020 CLINICAL DATA:  Cough and  fever for 4 days. EXAM: CHEST - 2 VIEW COMPARISON:  06/24/2015 FINDINGS: Heart size is normal. Aortic atherosclerotic calcification noted. Tortuosity of thoracic aorta is stable. Pulmonary hyperinflation is again seen, consistent with COPD. No evidence of pulmonary infiltrate or pleural effusion. Surgical clips again seen in the right axilla. IMPRESSION: COPD. No active lung disease. Electronically Signed   By: Marlaine Hind M.D.   On: 10/21/2020 15:40    Procedures Procedures (including critical care time)  Medications Ordered in UC Medications - No data to display  Initial Impression / Assessment and Plan / UC Course  I have reviewed the triage vital signs and the nursing notes.  Pertinent labs & imaging results that were available during my care of the patient were reviewed by me and considered in my medical decision making (see chart for details).   Patient is a very pleasant, nontoxic-appearing 87 year old female here for evaluation of COVID-like symptoms that began 4 days ago.  She has had a nonproductive cough, wheezing, some diarrhea, clear nasal discharge, headache, and elevated temp with a T-max of 99.8.  Patient's physical exam reveals bilateral tympanic membranes that are pearly gray with a normal light reflex and clear external auditory canals.  Nasal mucosa is erythematous and edematous with clear nasal discharge.  Posterior oropharynx has erythema with clear postnasal drip.  No cervical lymphadenopathy.  Lung sounds have scattered wheezing throughout all lung fields.  COVID swab and chest x-ray collected at triage.  Chest x-ray is negative for any acute intrathoracic process.  Patient's husband chest x-ray shows multilobar pneumonia consistent with COVID.  We will discharge patient home on one of her to bear twice daily x5 days with Tessalon Perles help with cough.  Patient states that she has not butyryl inhaler at  home which also help with her cough and wheezing.  ER precautions reviewed with patient and family.   Final Clinical Impressions(s) / UC Diagnoses   Final diagnoses:  Viral URI with cough     Discharge Instructions     Quarantine until you have reached 5 days of symptoms and then at that time you can break quarantine if your symptoms have improved and you have not had a fever for 24 hours a taken Tylenol and ibuprofen.  Take over-the-counter Tylenol as needed for fever.  Use your albuterol inhaler as needed for wheezing and cough.  Use the Tessalon Perles every 8 hours as needed for cough.  Take them with a small sip of water.  Take the molnupiravir 4 capsules twice daily for the next 5 days.  If you develop any shortness of breath, especially at rest, you cannot catch her breath, you cannot speak in full sentences, or as a late sign your lips are turning blue you need to go to the ER for evaluation.    ED Prescriptions    Medication Sig Dispense Auth. Provider   benzonatate (TESSALON) 100 MG capsule Take 2 capsules (200 mg total) by mouth every 8 (eight) hours. 21 capsule Margarette Canada, NP   molnupiravir EUA 200 mg CAPS Take 4 capsules (800 mg total) by mouth 2 (two) times daily for 5 days. 40 capsule Margarette Canada, NP     PDMP not reviewed this encounter.   Margarette Canada, NP 10/21/20 [phone]

## 2020-10-22 ENCOUNTER — Other Ambulatory Visit: Payer: Self-pay

## 2020-10-22 ENCOUNTER — Telehealth: Payer: Self-pay

## 2020-10-22 DIAGNOSIS — D45 Polycythemia vera: Secondary | ICD-10-CM

## 2020-10-22 LAB — SARS CORONAVIRUS 2 (TAT 6-24 HRS): SARS Coronavirus 2: NEGATIVE

## 2020-11-20 ENCOUNTER — Inpatient Hospital Stay: Payer: Medicare Other | Attending: Oncology

## 2020-11-20 ENCOUNTER — Other Ambulatory Visit: Payer: Self-pay

## 2020-11-20 DIAGNOSIS — D45 Polycythemia vera: Secondary | ICD-10-CM | POA: Diagnosis not present

## 2020-11-20 LAB — CBC
HCT: 39 % (ref 36.0–46.0)
Hemoglobin: 12.6 g/dL (ref 12.0–15.0)
MCH: 31.1 pg (ref 26.0–34.0)
MCHC: 32.3 g/dL (ref 30.0–36.0)
MCV: 96.3 fL (ref 80.0–100.0)
Platelets: 411 10*3/uL — ABNORMAL HIGH (ref 150–400)
RBC: 4.05 MIL/uL (ref 3.87–5.11)
RDW: 14.9 % (ref 11.5–15.5)
WBC: 5.6 10*3/uL (ref 4.0–10.5)
nRBC: 0 % (ref 0.0–0.2)

## 2020-12-23 ENCOUNTER — Other Ambulatory Visit: Payer: Self-pay

## 2020-12-23 DIAGNOSIS — D45 Polycythemia vera: Secondary | ICD-10-CM

## 2020-12-24 ENCOUNTER — Telehealth: Payer: Self-pay | Admitting: Nurse Practitioner

## 2020-12-24 NOTE — Telephone Encounter (Signed)
12/24/2020 Left VM informing pt that provider will be leaving at 1:30 on 6/28, and that she will need to come at 10 am or r/s for a different date. Call back number left and appt moved up per provider request  SRW

## 2020-12-25 ENCOUNTER — Inpatient Hospital Stay: Payer: Medicare Other | Admitting: Nurse Practitioner

## 2020-12-25 ENCOUNTER — Encounter: Payer: Self-pay | Admitting: Nurse Practitioner

## 2020-12-25 ENCOUNTER — Other Ambulatory Visit: Payer: Self-pay

## 2020-12-25 ENCOUNTER — Inpatient Hospital Stay (HOSPITAL_BASED_OUTPATIENT_CLINIC_OR_DEPARTMENT_OTHER): Payer: Medicare Other | Admitting: Nurse Practitioner

## 2020-12-25 ENCOUNTER — Other Ambulatory Visit: Payer: Self-pay | Admitting: Nurse Practitioner

## 2020-12-25 ENCOUNTER — Inpatient Hospital Stay: Payer: Medicare Other

## 2020-12-25 ENCOUNTER — Inpatient Hospital Stay: Payer: Medicare Other | Attending: Nurse Practitioner

## 2020-12-25 VITALS — BP 122/70 | HR 71 | Temp 98.4°F | Resp 16 | Wt 148.9 lb

## 2020-12-25 DIAGNOSIS — Z86711 Personal history of pulmonary embolism: Secondary | ICD-10-CM | POA: Diagnosis not present

## 2020-12-25 DIAGNOSIS — D45 Polycythemia vera: Secondary | ICD-10-CM

## 2020-12-25 DIAGNOSIS — M8580 Other specified disorders of bone density and structure, unspecified site: Secondary | ICD-10-CM | POA: Diagnosis not present

## 2020-12-25 DIAGNOSIS — N183 Chronic kidney disease, stage 3 unspecified: Secondary | ICD-10-CM | POA: Insufficient documentation

## 2020-12-25 DIAGNOSIS — Z853 Personal history of malignant neoplasm of breast: Secondary | ICD-10-CM | POA: Diagnosis not present

## 2020-12-25 DIAGNOSIS — Z87891 Personal history of nicotine dependence: Secondary | ICD-10-CM | POA: Diagnosis not present

## 2020-12-25 DIAGNOSIS — Z79899 Other long term (current) drug therapy: Secondary | ICD-10-CM | POA: Insufficient documentation

## 2020-12-25 DIAGNOSIS — Z7982 Long term (current) use of aspirin: Secondary | ICD-10-CM | POA: Insufficient documentation

## 2020-12-25 LAB — COMPREHENSIVE METABOLIC PANEL
ALT: 11 U/L (ref 0–44)
AST: 16 U/L (ref 15–41)
Albumin: 3.7 g/dL (ref 3.5–5.0)
Alkaline Phosphatase: 87 U/L (ref 38–126)
Anion gap: 5 (ref 5–15)
BUN: 20 mg/dL (ref 8–23)
CO2: 27 mmol/L (ref 22–32)
Calcium: 9.8 mg/dL (ref 8.9–10.3)
Chloride: 103 mmol/L (ref 98–111)
Creatinine, Ser: 1.12 mg/dL — ABNORMAL HIGH (ref 0.44–1.00)
GFR, Estimated: 46 mL/min — ABNORMAL LOW (ref >60)
Glucose, Bld: 101 mg/dL — ABNORMAL HIGH (ref 70–99)
Potassium: 4.6 mmol/L (ref 3.5–5.1)
Sodium: 135 mmol/L (ref 135–145)
Total Bilirubin: 0.5 mg/dL (ref 0.3–1.2)
Total Protein: 6.8 g/dL (ref 6.5–8.1)

## 2020-12-25 LAB — CBC WITH DIFFERENTIAL/PLATELET
Abs Immature Granulocytes: 0.01 10*3/uL (ref 0.00–0.07)
Basophils Absolute: 0 10*3/uL (ref 0.0–0.1)
Basophils Relative: 1 %
Eosinophils Absolute: 0.2 10*3/uL (ref 0.0–0.5)
Eosinophils Relative: 4 %
HCT: 35.2 % — ABNORMAL LOW (ref 36.0–46.0)
Hemoglobin: 11.5 g/dL — ABNORMAL LOW (ref 12.0–15.0)
Immature Granulocytes: 0 %
Lymphocytes Relative: 13 %
Lymphs Abs: 0.7 10*3/uL (ref 0.7–4.0)
MCH: 31.3 pg (ref 26.0–34.0)
MCHC: 32.7 g/dL (ref 30.0–36.0)
MCV: 95.9 fL (ref 80.0–100.0)
Monocytes Absolute: 0.5 10*3/uL (ref 0.1–1.0)
Monocytes Relative: 10 %
Neutro Abs: 3.7 10*3/uL (ref 1.7–7.7)
Neutrophils Relative %: 72 %
Platelets: 412 10*3/uL — ABNORMAL HIGH (ref 150–400)
RBC: 3.67 MIL/uL — ABNORMAL LOW (ref 3.87–5.11)
RDW: 15.8 % — ABNORMAL HIGH (ref 11.5–15.5)
WBC: 5 10*3/uL (ref 4.0–10.5)
nRBC: 0 % (ref 0.0–0.2)

## 2020-12-25 MED ORDER — HYDROXYUREA 500 MG PO CAPS: ORAL_CAPSULE | ORAL | 0 refills | Status: DC

## 2020-12-25 NOTE — Progress Notes (Signed)
[facility]  [address], [address] [place],  [address] Phone: [phone]  Fax: [phone]   Clinic Day:  12/25/2020  Referring physician: Ezequiel Kayser, MD  Chief Complaint: Summer Green is a 87 y.o. female with polycythemia rubra vera (PV) who is seen for follow up  HPI: Patient is 87 year old female who was referred by her PCP for management of PV. The patient was diagnosed with polycythemia rubra vera in 2018.  JAK2 mutation assay was positive for V617F mutation on 09/15/2016.  Bone marrow on 11/03/2016 showed first regular bone marrow with panmyelosis and some lymphoid aggregates. Special stains were not diagnostic of lymphoproliferative disease morphologically. Flow cytometry showed small population (1.2%) of monoclonal B cells.   She undergoes phlebotomies every 1-2 weeks as needed to maintain a hematocrit < 45. Her last phlebotomy was about 6 months ago. She was started on hydroxyurea in 2018; she currently takes 500 mg every Monday, Tuesday, Thursday, and Friday. She used to take it everyday.  Labs followed: 11/23/2018:  Hematocrit was 43.0, hemoglobin 13,7, MCV 93.9, platelets 484,000, WBC 7,000. 02/15/2019:  Hematocrit was 42.7, hemoglobin 13.3, MCV 98.2, platelets 448,000, WBC 6,260. 05/10/2019:  Hematocrit was 42.4, hemoglobin 13.5, MCV 94.8, platelets 459,000, WBC 6,280. 08/10/2019:  Hematocrit was 42.2, hemoglobin 13.5, MCV 96.1, platelets 485,000, WBC 5,990. 11/16/2019:  Hematocrit was 41.1, hemoglobin 12.0, MCV 97.6, platelets 430,000, WBC 6,420.  She was diagnosed with stage IA right breast cancer in 10/2010 (ER+, PR-, HER2-). She underwent right lumpectomy and lymph node biopsy on 11/08/2010. She completed radiation in 01/2011. She received anastrozole from 01/2011 - 07/2016. Diagnostic mammogram on 04/19/2020 showed a subcentimeter benign-appearing intramammary lymph node at the left breast, 2:00 position, 12 cm from the nipple. There  was no evidence of malignancy.  She had a pulmonary embolism in 07/2016. She was treated with IV heparin x 3 days. Factor V Leiden and prothrombin gene mutation were negative on 09/12/2016.  She was on Pradaxa 150 mg BID (08/20/2016 - 02/20/2017) then Xarelto (02/21/2017 - 07/2017). Xarelto was discontinued due to a diverticular bleed. It was felt that her PE may have been provoked by anastrozole.  The patient has osteopenia. She has stage 3 chronic kidney disease.  Interval History: Patient returns to clinic for routine follow up. She is tolerating hydrea well without significant side effects. She feels well. No dizziness or weakness. No elevated blood pressures or headaches. Denies recent fevers or illnesses. Denies any easy bleeding or bruising. No melena or hematochezia. Reports good appetite and denies weight loss. Denies chest pain. Denies any nausea, vomiting, constipation, or diarrhea. Denies urinary complaints. Patient offers no further specific complaints today.  Past Medical History:  Diagnosis Date   Asthma    Breast cancer ([place])    right side   Bronchitis    Pulmonary embolism ([facility])     Past Surgical History:  Procedure Laterality Date   BREAST LUMPECTOMY     right side   CHOLECYSTECTOMY     TONSILLECTOMY      Family History  Problem Relation Age of Onset   Diabetes Mother    Heart disease Father     Social History:  reports that she has quit smoking. She has never used smokeless tobacco. She reports that she does not drink alcohol and does not use drugs. She quit smoking 50+ years ago. She smoked 1 pack per day. She does not drink alcohol. She is retired and used to run a business. She  previously lived in [place] and has been in [place] since 05/2020. She and her husband live with her son and daughter in law. Her daughter in law is Santiago Glad. The patient is accompanied by her son today.   Allergies:  Allergies  Allergen Reactions   Shellfish Allergy Anaphylaxis   Amoxicillin     Codeine    Iodine    Morphine And Related     Current Medications: Current Outpatient Medications  Medication Sig Dispense Refill   acetaminophen (TYLENOL) 500 MG tablet Take by mouth.     aspirin 81 MG EC tablet Take by mouth.     benzonatate (TESSALON) 100 MG capsule Take 2 capsules (200 mg total) by mouth every 8 (eight) hours. 21 capsule 0   cyanocobalamin 1000 MCG tablet Take by mouth.     desonide (DESOWEN) 0.05 % cream Apply topically.     hydroxyurea (HYDREA) 500 MG capsule Take by mouth.     ipratropium-albuterol (DUONEB) 0.5-2.5 (3) MG/3ML SOLN Inhale into the lungs.     loratadine (CLARITIN) 10 MG tablet Take 10 mg by mouth daily.     loratadine (CLARITIN) 10 MG tablet Take by mouth.     Magnesium 250 MG TABS Take by mouth.     OMEPRAZOLE PO Take by mouth.     predniSONE (DELTASONE) 20 MG tablet Take 20 mg by mouth daily.     hydroxyurea (HYDREA) 500 MG capsule Take 1 tablet by mouth on Monday, Tuesday, Thursday, Friday every week. May take with food to minimize GI side effects. (Patient not taking: Reported on 12/25/2020) 50 capsule 0   No current facility-administered medications for this visit.    Review of Systems  Constitutional:  Negative for chills, fever, malaise/fatigue and weight loss.  HENT:  Negative for hearing loss, nosebleeds, sore throat and tinnitus.   Eyes:  Negative for blurred vision and double vision.  Respiratory:  Negative for cough, hemoptysis, shortness of breath and wheezing.   Cardiovascular:  Negative for chest pain, palpitations and leg swelling.  Gastrointestinal:  Negative for abdominal pain, blood in stool, constipation, diarrhea, melena, nausea and vomiting.  Genitourinary:  Negative for dysuria and urgency.  Musculoskeletal:  Negative for back pain, falls, joint pain and myalgias.  Skin:  Negative for itching and rash.  Neurological:  Negative for dizziness, tingling, sensory change, loss of consciousness, weakness and headaches.   Endo/Heme/Allergies:  Negative for environmental allergies. Does not bruise/bleed easily.  Psychiatric/Behavioral:  Negative for depression. The patient is not nervous/anxious and does not have insomnia.   Performance status (ECOG): 1  Vitals Blood pressure 122/70, pulse 71, temperature 98.4 F (36.9 C), resp. rate 16, weight 148 lb 14.7 oz (67.5 kg), SpO2 97 %.   General: Well-developed, well-nourished, no acute distress. Eyes: Pink conjunctiva, anicteric sclera. Lungs: Clear to auscultation bilaterally.  No audible wheezing or coughing Heart: Regular rate and rhythm.  Abdomen: Soft, nontender, nondistended.  Musculoskeletal: No edema, cyanosis, or clubbing. Neuro: Alert, answering all questions appropriately. Cranial nerves grossly intact. Skin: No rashes or petechiae noted. Psych: Normal affect.   Appointment on 12/25/2020  Component Date Value Ref Range Status   Sodium 12/25/2020 135  135 - 145 mmol/L Final   Potassium 12/25/2020 4.6  3.5 - 5.1 mmol/L Final   Chloride 12/25/2020 103  98 - 111 mmol/L Final   CO2 12/25/2020 27  22 - 32 mmol/L Final   Glucose, Bld 12/25/2020 101 (A) 70 - 99 mg/dL Final   Glucose reference range applies  only to samples taken after fasting for at least 8 hours.   BUN 12/25/2020 20  8 - 23 mg/dL Final   Creatinine, Ser 12/25/2020 1.12 (A) 0.44 - 1.00 mg/dL Final   Calcium 12/25/2020 9.8  8.9 - 10.3 mg/dL Final   Total Protein 12/25/2020 6.8  6.5 - 8.1 g/dL Final   Albumin 12/25/2020 3.7  3.5 - 5.0 g/dL Final   AST 12/25/2020 16  15 - 41 U/L Final   ALT 12/25/2020 11  0 - 44 U/L Final   Alkaline Phosphatase 12/25/2020 87  38 - 126 U/L Final   Total Bilirubin 12/25/2020 0.5  0.3 - 1.2 mg/dL Final   GFR, Estimated 12/25/2020 46 (A) >60 mL/min Final   Comment: (NOTE) Calculated using the CKD-EPI Creatinine Equation (2021)    Anion gap 12/25/2020 5  5 - 15 Final   Performed at [facility], [address] [place], [place] [address]    WBC 12/25/2020 5.0  4.0 - 10.5 K/uL Final   RBC 12/25/2020 3.67 (A) 3.87 - 5.11 MIL/uL Final   Hemoglobin 12/25/2020 11.5 (A) 12.0 - 15.0 g/dL Final   HCT 12/25/2020 35.2 (A) 36.0 - 46.0 % Final   MCV 12/25/2020 95.9  80.0 - 100.0 fL Final   MCH 12/25/2020 31.3  26.0 - 34.0 pg Final   MCHC 12/25/2020 32.7  30.0 - 36.0 g/dL Final   RDW 12/25/2020 15.8 (A) 11.5 - 15.5 % Final   Platelets 12/25/2020 412 (A) 150 - 400 K/uL Final   nRBC 12/25/2020 0.0  0.0 - 0.2 % Final   Neutrophils Relative % 12/25/2020 72  % Final   Neutro Abs 12/25/2020 3.7  1.7 - 7.7 K/uL Final   Lymphocytes Relative 12/25/2020 13  % Final   Lymphs Abs 12/25/2020 0.7  0.7 - 4.0 K/uL Final   Monocytes Relative 12/25/2020 10  % Final   Monocytes Absolute 12/25/2020 0.5  0.1 - 1.0 K/uL Final   Eosinophils Relative 12/25/2020 4  % Final   Eosinophils Absolute 12/25/2020 0.2  0.0 - 0.5 K/uL Final   Basophils Relative 12/25/2020 1  % Final   Basophils Absolute 12/25/2020 0.0  0.0 - 0.1 K/uL Final   Immature Granulocytes 12/25/2020 0  % Final   Abs Immature Granulocytes 12/25/2020 0.01  0.00 - 0.07 K/uL Final   Performed at [facility], [address], [place], [place] [address]    Assessment:    1.   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 currently on hydrea 500 mg 4 days a week. Hematocrit goal is < or = 42. Platelet goal is < 400,000. Tolerating well. Labs reviewed today. No changes to treatment. She takes aspirin 81 mg daily.   2.   Stage IA right breast cancer- clinically asymptomatic. ER+, s/p right lumpectomy and SLN bx 11/08/2010. Followed by radiation completed 01/2011. She has completed hormonal treatment 01/2011-07/2016. Last mammogram 04/19/20 was negative. Continue yearly surveillance with breast exam and mammogram in October 2022.   3. Hx of PE- felt to be secondary to anastrozole. Hypercoaguable workup was negative. Anticoagulation d/c due to diverticular bleed.   4.  Osteopenia- continue calcium and vitamin d along with weight bearing exercise as tolerated  5. CKD- stage 3. Managed by nephrology. Monitor, particularly in setting of anemia and PV.   Plan: 3 mo - lab, md Refilled hdyra x 4 /week  I discussed the assessment and treatment plan with the patient.  The patient was  provided an opportunity to ask questions and all were answered.  The patient agreed with the plan and demonstrated an understanding of the instructions.  The patient was advised to call back if the symptoms worsen or if the condition fails to improve as anticipated.  Beckey Rutter, DNP, AGNP-C [place] at [facility] [phone] (clinic)

## 2021-01-11 ENCOUNTER — Other Ambulatory Visit: Payer: Self-pay

## 2021-01-11 ENCOUNTER — Ambulatory Visit (INDEPENDENT_AMBULATORY_CARE_PROVIDER_SITE_OTHER): Payer: Medicare Other

## 2021-01-11 ENCOUNTER — Ambulatory Visit
Admission: EM | Admit: 2021-01-11 | Discharge: 2021-01-11 | Disposition: A | Discharge status: Home / Self Care | Payer: Medicare Other | Attending: Family Medicine | Admitting: Family Medicine

## 2021-01-11 DIAGNOSIS — W19XXXA Unspecified fall, initial encounter: Secondary | ICD-10-CM

## 2021-01-11 DIAGNOSIS — S62616A Displaced fracture of proximal phalanx of right little finger, initial encounter for closed fracture: Secondary | ICD-10-CM

## 2021-01-11 DIAGNOSIS — M79644 Pain in right finger(s): Secondary | ICD-10-CM

## 2021-01-11 NOTE — Discharge Instructions (Addendum)
Wear the splint at all times to help protect your fifth finger from further injury.  Keep your right hand elevated as much as possible.  This will help minimize swelling as well as provide pain relief.  Decrease in swelling will help improve healing.  Use over-the-counter Tylenol going to the back instructions as needed for pain relief.  You can also apply ice to your right fifth finger for 20 minutes at a time 2-3 times a day.  Call make an appointment to follow-up with Dr. Verita Lamb at [facility].  Her phone number is [phone]

## 2021-01-11 NOTE — ED Triage Notes (Signed)
Patient states that she stumbled at home and tried to catch herself against a door. States that right pinky finger bent all the way back and was facing another direction. States that she pulled it and thinks it went back in to place. Patient with pain and bruising to the area.

## 2021-01-11 NOTE — ED Provider Notes (Signed)
[facility]    CSN: [id number] Arrival date & time: 01/11/21  1443      History   Chief Complaint Chief Complaint  Patient presents with   Finger Injury    Right pinky finger     HPI Summer Green is a 87 y.o. female.   HPI  87 year old female here for evaluation of right finger injury.  Patient is here with her family and is reporting that she stumbled at home and will try to catch her self she bent her right fifth finger back into the side.  She reports that it was pointing out at 90 degrees from her hand.  She states that she grabbed her finger and pulled it back straight but she is here because she is having pain at the proximal phalanx, swelling, and bruising.  Patient denies numbness or tingling of her finger.  She has been applying ice at home.  Past Medical History:  Diagnosis Date   Asthma    Breast cancer ([place])    right side   Bronchitis    Pulmonary embolism [facility])     Patient Active Problem List   Diagnosis Date Noted   Polycythemia vera ([place]) 09/05/2020    Past Surgical History:  Procedure Laterality Date   BREAST LUMPECTOMY     right side   CHOLECYSTECTOMY     TONSILLECTOMY      OB History   No obstetric history on file.      Home Medications    Prior to Admission medications   Medication Sig Start Date End Date Taking? Authorizing Provider  acetaminophen (TYLENOL) 500 MG tablet Take by mouth.   Yes [provider]  aspirin 81 MG EC tablet Take by mouth.   Yes [provider]  benzonatate (TESSALON) 100 MG capsule Take 2 capsules (200 mg total) by mouth every 8 (eight) hours. 10/21/20  Yes Margarette Canada, NP  cyanocobalamin 1000 MCG tablet Take by mouth.   Yes [provider]  desonide (DESOWEN) 0.05 % cream Apply topically. 12/20/19  Yes [provider]  hydroxyurea (HYDREA) 500 MG capsule TAKE ONE CAPSULE BY MOUTH WITH FOOD ON MONDAY, TUESDAY, THURSDAY AND FRIDAY EVERY WEEK TO MINIMIZE GI SIDE  EFFECTS. 12/28/20  Yes Verlon Au, NP  ipratropium-albuterol (DUONEB) 0.5-2.5 (3) MG/3ML SOLN Inhale into the lungs.   Yes [provider]  loratadine (CLARITIN) 10 MG tablet Take 10 mg by mouth daily.   Yes [provider]  loratadine (CLARITIN) 10 MG tablet Take by mouth.   Yes [provider]  Magnesium 250 MG TABS Take by mouth.   Yes [provider]  OMEPRAZOLE PO Take by mouth.   Yes [provider]  predniSONE (DELTASONE) 20 MG tablet Take 20 mg by mouth daily. 09/06/20   [provider]    Family History Family History  Problem Relation Age of Onset   Diabetes Mother    Heart disease Father     Social History Social History   Tobacco Use   Smoking status: Former   Smokeless tobacco: Never  Scientific laboratory technician Use: Never used  Substance Use Topics   Alcohol use: No   Drug use: No     Allergies   Shellfish allergy, Amoxicillin, Codeine, Iodine, and Morphine and related   Review of Systems Review of Systems  Constitutional:  Negative for activity change, appetite change and fever.  Musculoskeletal:  Positive for arthralgias and myalgias.  Skin:  Positive for  color change.  Neurological:  Negative for weakness and numbness.    Physical Exam Triage Vital Signs ED Triage Vitals  Enc Vitals Group     BP 01/11/21 1500 119/71     Pulse Rate 01/11/21 1500 71     Resp 01/11/21 1500 18     Temp 01/11/21 1500 98.5 F (36.9 C)     Temp Source 01/11/21 1500 Oral     SpO2 01/11/21 1500 97 %     Weight 01/11/21 1456 146 lb (66.2 kg)     Height 01/11/21 1456 5\' 6"  (1.676 m)     Head Circumference --      Peak Flow --      Pain Score 01/11/21 1456 8     Pain Loc --      Pain Edu? --      Excl. in [place]? --    No data found.  Updated Vital Signs BP 119/71 (BP Location: Left Arm)   Pulse 71   Temp 98.5 F (36.9 C) (Oral)   Resp 18   Ht 5\' 6"  (1.676 m)   Wt 146 lb (66.2 kg)   SpO2 97%   BMI 23.57 kg/m    Visual Acuity Right Eye Distance:   Left Eye Distance:   Bilateral Distance:    Right Eye Near:   Left Eye Near:    Bilateral Near:     Physical Exam Vitals and nursing note reviewed.  Constitutional:      General: She is not in acute distress.    Appearance: Normal appearance. She is normal weight. She is not ill-appearing.  HENT:     Head: Normocephalic and atraumatic.  Musculoskeletal:        General: Swelling, tenderness, deformity and signs of injury present.  Skin:    General: Skin is warm and dry.     Capillary Refill: Capillary refill takes 2 to 3 seconds.     Findings: Bruising present.  Neurological:     General: No focal deficit present.     Mental Status: She is alert and oriented to person, place, and time.     UC Treatments / Results  Labs (all labs ordered are listed, but only abnormal results are displayed) Labs Reviewed - No data to display  EKG   Radiology No results found.  Procedures Procedures (including critical care time)  Medications Ordered in UC Medications - No data to display  Initial Impression / Assessment and Plan / UC Course  I have reviewed the triage vital signs and the nursing notes.  Pertinent labs & imaging results that were available during my care of the patient were reviewed by me and considered in my medical decision making (see chart for details).  Patient is a very pleasant 87 year old female here for evaluation of injury to her right fifth finger that happened approximately 1 and half hours ago as outlined in the HPI above.  Patient's physical exam reveals ecchymosis and edema at the proximal phalanx of the right fifth finger.  PIP joint, DIP joint, and MCP joints are free of tenderness.  Patient states she has full sensation in her distal cap refill is approximately 2 seconds.  She has no tenderness to palpation of the fifth metacarpal.  She does have limited range of motion secondary to pain and swelling of the right  finger but there is no abnormalities to the remaining fingers of the right hand.  X-ray of the right finger obtained at triage.  Right for  finger x-ray independently reviewed and evaluated by me.  There is a comminuted fracture of the proximal phalanx of the right fifth finger that is largely in proper anatomical alignment.  Awaiting radiology overread.  Will place patient in ulnar gutter splint, have her keep her right hand elevated to help with swelling, use Tylenol for pain as well as ice, and follow-up with Dr. Jackqulyn Livings at [facility] next week.   Final Clinical Impressions(s) / UC Diagnoses   Final diagnoses:  Closed displaced fracture of proximal phalanx of right little finger, initial encounter     Discharge Instructions      Wear the splint at all times to help protect your fifth finger from further injury.  Keep your right hand elevated as much as possible.  This will help minimize swelling as well as provide pain relief.  Decrease in swelling will help improve healing.  Use over-the-counter Tylenol going to the back instructions as needed for pain relief.  You can also apply ice to your right fifth finger for 20 minutes at a time 2-3 times a day.  Call make an appointment to follow-up with Dr. Verita Lamb at [facility].  Her phone number is [phone]     ED Prescriptions   None    PDMP not reviewed this encounter.   Margarette Canada, NP 01/11/21 1531

## 2021-03-26 ENCOUNTER — Other Ambulatory Visit: Payer: Self-pay

## 2021-03-26 DIAGNOSIS — D45 Polycythemia vera: Secondary | ICD-10-CM

## 2021-03-27 ENCOUNTER — Encounter: Payer: Self-pay | Admitting: Oncology

## 2021-03-27 ENCOUNTER — Inpatient Hospital Stay: Payer: Medicare Other | Attending: Oncology

## 2021-03-27 ENCOUNTER — Inpatient Hospital Stay (HOSPITAL_BASED_OUTPATIENT_CLINIC_OR_DEPARTMENT_OTHER): Payer: Medicare Other | Admitting: Oncology

## 2021-03-27 ENCOUNTER — Other Ambulatory Visit: Payer: Self-pay

## 2021-03-27 VITALS — BP 146/71 | HR 66 | Temp 98.2°F | Resp 16 | Wt 149.0 lb

## 2021-03-27 DIAGNOSIS — Z79899 Other long term (current) drug therapy: Secondary | ICD-10-CM | POA: Diagnosis not present

## 2021-03-27 DIAGNOSIS — J45909 Unspecified asthma, uncomplicated: Secondary | ICD-10-CM | POA: Insufficient documentation

## 2021-03-27 DIAGNOSIS — Z86711 Personal history of pulmonary embolism: Secondary | ICD-10-CM | POA: Insufficient documentation

## 2021-03-27 DIAGNOSIS — Z853 Personal history of malignant neoplasm of breast: Secondary | ICD-10-CM | POA: Insufficient documentation

## 2021-03-27 DIAGNOSIS — Z87891 Personal history of nicotine dependence: Secondary | ICD-10-CM | POA: Insufficient documentation

## 2021-03-27 DIAGNOSIS — Z7952 Long term (current) use of systemic steroids: Secondary | ICD-10-CM | POA: Insufficient documentation

## 2021-03-27 DIAGNOSIS — D45 Polycythemia vera: Secondary | ICD-10-CM | POA: Insufficient documentation

## 2021-03-27 LAB — CBC WITH DIFFERENTIAL/PLATELET
Abs Immature Granulocytes: 0.02 10*3/uL (ref 0.00–0.07)
Basophils Absolute: 0.1 10*3/uL (ref 0.0–0.1)
Basophils Relative: 1 %
Eosinophils Absolute: 0.1 10*3/uL (ref 0.0–0.5)
Eosinophils Relative: 2 %
HCT: 38.3 % (ref 36.0–46.0)
Hemoglobin: 12.7 g/dL (ref 12.0–15.0)
Immature Granulocytes: 0 %
Lymphocytes Relative: 22 %
Lymphs Abs: 1.3 10*3/uL (ref 0.7–4.0)
MCH: 31.4 pg (ref 26.0–34.0)
MCHC: 33.2 g/dL (ref 30.0–36.0)
MCV: 94.6 fL (ref 80.0–100.0)
Monocytes Absolute: 0.4 10*3/uL (ref 0.1–1.0)
Monocytes Relative: 7 %
Neutro Abs: 3.8 10*3/uL (ref 1.7–7.7)
Neutrophils Relative %: 68 %
Platelets: 463 10*3/uL — ABNORMAL HIGH (ref 150–400)
RBC: 4.05 MIL/uL (ref 3.87–5.11)
RDW: 14.6 % (ref 11.5–15.5)
WBC: 5.6 10*3/uL (ref 4.0–10.5)
nRBC: 0 % (ref 0.0–0.2)

## 2021-03-27 LAB — COMPREHENSIVE METABOLIC PANEL
ALT: 11 U/L (ref 0–44)
AST: 16 U/L (ref 15–41)
Albumin: 4 g/dL (ref 3.5–5.0)
Alkaline Phosphatase: 82 U/L (ref 38–126)
Anion gap: 6 (ref 5–15)
BUN: 22 mg/dL (ref 8–23)
CO2: 26 mmol/L (ref 22–32)
Calcium: 9.8 mg/dL (ref 8.9–10.3)
Chloride: 102 mmol/L (ref 98–111)
Creatinine, Ser: 1.04 mg/dL — ABNORMAL HIGH (ref 0.44–1.00)
GFR, Estimated: 50 mL/min — ABNORMAL LOW (ref >60)
Glucose, Bld: 98 mg/dL (ref 70–99)
Potassium: 4.5 mmol/L (ref 3.5–5.1)
Sodium: 134 mmol/L — ABNORMAL LOW (ref 135–145)
Total Bilirubin: 0.7 mg/dL (ref 0.3–1.2)
Total Protein: 6.7 g/dL (ref 6.5–8.1)

## 2021-03-31 NOTE — Progress Notes (Signed)
Hematology/Oncology Consult note [facility]  Telephone:([phone] [phone]  Patient Care Team: Ezequiel Kayser, MD (Inactive) as PCP - General (Internal Medicine) Lequita Asal, MD (Inactive) as Referring Physician (Hematology and Oncology)   Name of the patient: Summer Green  [id number]  [birth year]   Date of visit: 03/31/21  Diagnosis-JAK2 positive polycythemia vera  Chief complaint/ Reason for visit-routine follow-up of polycythemia vera  Heme/Onc history: Patient is a 87 year old female who was diagnosed with JAK2 positive polycythemia vera back in 2018 in [place].  Patient states that she has had multiple episodes of phlebotomy back then but has not required phlebotomy now for a long time. JAK2 revealed V617F mutation on 09/15/2016. Factor V Leiden and prothrombin gene mutation were negative on 09/12/2016. Bone marrow on 11/03/2016 showed first regular bone marrow with panmyelosis and some lymphoid aggregates. Special stains were not diagnostic of lymphoproliferative disease morphologically. Flow cytometry showed small population (1.2%) of monoclonal B cells.    She also has a history of stage I right breast cancer in 2012 and has completed lumpectomy radiation treatment as well as adjuvant hormone therapy.  History of PE in 2018 possibly secondary to anastrozole versus P vera for which she was on Pradaxa followed by Xarelto.  Xarelto was then discontinued due to diverticular bleed in 2019 and patient has not had any thrombotic episodes since then.  Interval history-patient is doing well on Hydrea and denies any specific complaints at this time.  She is here with her daughter-in-law today  ECOG PS- 1 Pain scale- 0   Review of systems- Review of Systems  Constitutional:  Positive for malaise/fatigue. Negative for chills, fever and weight loss.  HENT:  Negative for congestion, ear discharge and nosebleeds.   Eyes:  Negative for blurred  vision.  Respiratory:  Negative for cough, hemoptysis, sputum production, shortness of breath and wheezing.   Cardiovascular:  Negative for chest pain, palpitations, orthopnea and claudication.  Gastrointestinal:  Negative for abdominal pain, blood in stool, constipation, diarrhea, heartburn, melena, nausea and vomiting.  Genitourinary:  Negative for dysuria, flank pain, frequency, hematuria and urgency.  Musculoskeletal:  Negative for back pain, joint pain and myalgias.  Skin:  Negative for rash.  Neurological:  Negative for dizziness, tingling, focal weakness, seizures, weakness and headaches.  Endo/Heme/Allergies:  Does not bruise/bleed easily.  Psychiatric/Behavioral:  Negative for depression and suicidal ideas. The patient does not have insomnia.     Allergies  Allergen Reactions   Shellfish Allergy Anaphylaxis   Amoxicillin    Codeine    Iodine    Morphine And Related      Past Medical History:  Diagnosis Date   Asthma    Breast cancer ([place])    right side   Bronchitis    Pulmonary embolism ([facility])      Past Surgical History:  Procedure Laterality Date   BREAST LUMPECTOMY     right side   CHOLECYSTECTOMY     TONSILLECTOMY      Social History   Socioeconomic History   Marital status: Married    Spouse name: Not on file   Number of children: Not on file   Years of education: Not on file   Highest education level: Not on file  Occupational History   Not on file  Tobacco Use   Smoking status: Former   Smokeless tobacco: Never  Vaping Use   Vaping Use: Never used  Substance and Sexual Activity   Alcohol use: No  Drug use: No   Sexual activity: Not Currently  Other Topics Concern   Not on file  Social History Narrative   Not on file   Social Determinants of Health   Financial Resource Strain: Not on file  Food Insecurity: Not on file  Transportation Needs: Not on file  Physical Activity: Not on file  Stress: Not on file  Social Connections: Not on  file  Intimate Partner Violence: Not on file    Family History  Problem Relation Age of Onset   Diabetes Mother    Heart disease Father      Current Outpatient Medications:    acetaminophen (TYLENOL) 500 MG tablet, Take by mouth., Disp: , Rfl:    albuterol (VENTOLIN HFA) 108 (90 Base) MCG/ACT inhaler, Ventolin HFA 90 mcg/actuation aerosol inhaler  INHALE 2 INHALATIONS INTO THE LUNGS EVERY 4 HOURS AS NEEDED FOR WHEEZING OR SHORTNESS OF BREATH, Disp: , Rfl:    aspirin 81 MG EC tablet, Take by mouth., Disp: , Rfl:    cyanocobalamin 1000 MCG tablet, Take by mouth., Disp: , Rfl:    hydroxyurea (HYDREA) 500 MG capsule, TAKE ONE CAPSULE BY MOUTH WITH FOOD ON MONDAY, TUESDAY, THURSDAY AND FRIDAY EVERY WEEK TO MINIMIZE GI SIDE EFFECTS., Disp: 51 capsule, Rfl: 2   loratadine (CLARITIN) 10 MG tablet, Take 10 mg by mouth daily., Disp: , Rfl:    loratadine (CLARITIN) 10 MG tablet, Take by mouth., Disp: , Rfl:    Magnesium 250 MG TABS, Take by mouth., Disp: , Rfl:    OMEPRAZOLE PO, Take by mouth., Disp: , Rfl:    benzonatate (TESSALON) 100 MG capsule, Take 2 capsules (200 mg total) by mouth every 8 (eight) hours. (Patient not taking: Reported on 03/27/2021), Disp: 21 capsule, Rfl: 0   desonide (DESOWEN) 0.05 % cream, Apply topically., Disp: , Rfl:    ipratropium-albuterol (DUONEB) 0.5-2.5 (3) MG/3ML SOLN, Inhale into the lungs., Disp: , Rfl:    predniSONE (DELTASONE) 20 MG tablet, Take 20 mg by mouth daily. (Patient not taking: Reported on 03/27/2021), Disp: , Rfl:   Physical exam:  Vitals:   03/27/21 1359  BP: (!) 146/71  Pulse: 66  Resp: 16  Temp: 98.2 F (36.8 C)  SpO2: 99%  Weight: 149 lb 0.5 oz (67.6 kg)   Physical Exam Constitutional:      General: She is not in acute distress. Cardiovascular:     Rate and Rhythm: Normal rate and regular rhythm.     Heart sounds: Normal heart sounds.  Pulmonary:     Effort: Pulmonary effort is normal.     Breath sounds: Normal breath sounds.   Abdominal:     General: Bowel sounds are normal.     Palpations: Abdomen is soft.     Comments: No palpable hepatosplenomegaly  Skin:    General: Skin is warm and dry.  Neurological:     Mental Status: She is alert and oriented to person, place, and time.     CMP Latest Ref Rng & Units 03/27/2021  Glucose 70 - 99 mg/dL 98  BUN 8 - 23 mg/dL 22  Creatinine 0.44 - 1.00 mg/dL 1.04(H)  Sodium 135 - 145 mmol/L 134(L)  Potassium 3.5 - 5.1 mmol/L 4.5  Chloride 98 - 111 mmol/L 102  CO2 22 - 32 mmol/L 26  Calcium 8.9 - 10.3 mg/dL 9.8  Total Protein 6.5 - 8.1 g/dL 6.7  Total Bilirubin 0.3 - 1.2 mg/dL 0.7  Alkaline Phos 38 - 126 U/L 82  AST 15 - 41 U/L 16  ALT 0 - 44 U/L 11   CBC Latest Ref Rng & Units 03/27/2021  WBC 4.0 - 10.5 K/uL 5.6  Hemoglobin 12.0 - 15.0 g/dL 12.7  Hematocrit 36.0 - 46.0 % 38.3  Platelets 150 - 400 K/uL 463(H)     Assessment and plan- Patient is a 87 y.o. 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  Therefore our target is to keep her hematocrit less than 42.  Patient has not required phlebotomy for a long time now and her H&H presently is 12.7/38.3 which is at goal with Hydrea.  She does have borderline thrombocytosis and her platelet count was 546 back in 2016 and since then has been in the 400s.  Presently 463.  However since we are managing this as PV and not ET we do not have to increase her Hydrea dose to maintain her platelet counts less than 400.  Also increasing her Hydrea dose could potentially worsen her anemia and white cell count  Given the stability of her counts I will repeat CBC with differential, CMP in 3 months in 6 months and see her back in 6 months for a video visit   Visit Diagnosis 1. Polycythemia vera ([place])   2. High risk medication use      Dr.  Randa Evens, MD, MPH [facility] [place] [place] [phone] 03/31/2021 7:32 AM

## 2021-06-26 ENCOUNTER — Other Ambulatory Visit: Payer: Self-pay

## 2021-06-26 ENCOUNTER — Inpatient Hospital Stay: Payer: Medicare Other | Attending: Oncology

## 2021-06-26 DIAGNOSIS — D45 Polycythemia vera: Secondary | ICD-10-CM | POA: Diagnosis not present

## 2021-06-26 LAB — CBC WITH DIFFERENTIAL/PLATELET
Abs Immature Granulocytes: 0.01 10*3/uL (ref 0.00–0.07)
Basophils Absolute: 0.1 10*3/uL (ref 0.0–0.1)
Basophils Relative: 1 %
Eosinophils Absolute: 0.2 10*3/uL (ref 0.0–0.5)
Eosinophils Relative: 3 %
HCT: 38.1 % (ref 36.0–46.0)
Hemoglobin: 12.2 g/dL (ref 12.0–15.0)
Immature Granulocytes: 0 %
Lymphocytes Relative: 18 %
Lymphs Abs: 1.1 10*3/uL (ref 0.7–4.0)
MCH: 31.4 pg (ref 26.0–34.0)
MCHC: 32 g/dL (ref 30.0–36.0)
MCV: 98.2 fL (ref 80.0–100.0)
Monocytes Absolute: 0.4 10*3/uL (ref 0.1–1.0)
Monocytes Relative: 7 %
Neutro Abs: 4.3 10*3/uL (ref 1.7–7.7)
Neutrophils Relative %: 71 %
Platelets: 475 10*3/uL — ABNORMAL HIGH (ref 150–400)
RBC: 3.88 MIL/uL (ref 3.87–5.11)
RDW: 15.3 % (ref 11.5–15.5)
WBC: 6.1 10*3/uL (ref 4.0–10.5)
nRBC: 0 % (ref 0.0–0.2)

## 2021-07-12 NOTE — Telephone Encounter (Signed)
Signing encounter, See previous note on 10/22/20

## 2021-09-24 ENCOUNTER — Other Ambulatory Visit: Payer: Self-pay

## 2021-09-24 ENCOUNTER — Inpatient Hospital Stay: Payer: Medicare Other | Attending: Oncology

## 2021-09-24 ENCOUNTER — Other Ambulatory Visit: Payer: Medicare Other

## 2021-09-24 DIAGNOSIS — Z79899 Other long term (current) drug therapy: Secondary | ICD-10-CM | POA: Insufficient documentation

## 2021-09-24 DIAGNOSIS — Z853 Personal history of malignant neoplasm of breast: Secondary | ICD-10-CM | POA: Insufficient documentation

## 2021-09-24 DIAGNOSIS — Z923 Personal history of irradiation: Secondary | ICD-10-CM | POA: Diagnosis not present

## 2021-09-24 DIAGNOSIS — D45 Polycythemia vera: Secondary | ICD-10-CM | POA: Insufficient documentation

## 2021-09-24 DIAGNOSIS — Z86711 Personal history of pulmonary embolism: Secondary | ICD-10-CM | POA: Insufficient documentation

## 2021-09-24 LAB — CBC WITH DIFFERENTIAL/PLATELET
Abs Immature Granulocytes: 0.01 10*3/uL (ref 0.00–0.07)
Basophils Absolute: 0.1 10*3/uL (ref 0.0–0.1)
Basophils Relative: 1 %
Eosinophils Absolute: 0.2 10*3/uL (ref 0.0–0.5)
Eosinophils Relative: 3 %
HCT: 39.9 % (ref 36.0–46.0)
Hemoglobin: 12.7 g/dL (ref 12.0–15.0)
Immature Granulocytes: 0 %
Lymphocytes Relative: 24 %
Lymphs Abs: 1.5 10*3/uL (ref 0.7–4.0)
MCH: 31.5 pg (ref 26.0–34.0)
MCHC: 31.8 g/dL (ref 30.0–36.0)
MCV: 99 fL (ref 80.0–100.0)
Monocytes Absolute: 0.6 10*3/uL (ref 0.1–1.0)
Monocytes Relative: 10 %
Neutro Abs: 3.7 10*3/uL (ref 1.7–7.7)
Neutrophils Relative %: 62 %
Platelets: 461 10*3/uL — ABNORMAL HIGH (ref 150–400)
RBC: 4.03 MIL/uL (ref 3.87–5.11)
RDW: 14 % (ref 11.5–15.5)
WBC: 6 10*3/uL (ref 4.0–10.5)
nRBC: 0 % (ref 0.0–0.2)

## 2021-09-25 ENCOUNTER — Encounter: Payer: Self-pay | Admitting: Oncology

## 2021-09-25 ENCOUNTER — Inpatient Hospital Stay (HOSPITAL_BASED_OUTPATIENT_CLINIC_OR_DEPARTMENT_OTHER): Payer: Medicare Other | Admitting: Oncology

## 2021-09-25 DIAGNOSIS — D45 Polycythemia vera: Secondary | ICD-10-CM

## 2021-09-25 DIAGNOSIS — Z853 Personal history of malignant neoplasm of breast: Secondary | ICD-10-CM | POA: Diagnosis not present

## 2021-09-25 DIAGNOSIS — Z79899 Other long term (current) drug therapy: Secondary | ICD-10-CM | POA: Diagnosis not present

## 2021-09-25 MED ORDER — HYDROXYUREA 500 MG PO CAPS: ORAL_CAPSULE | ORAL | 2 refills | Status: DC

## 2021-09-26 NOTE — Progress Notes (Signed)
I connected with Summer Green on 09/26/21 at  2:45 PM EDT by video enabled telemedicine visit and verified that I am speaking with the correct person using two identifiers. ?  ?I discussed the limitations, risks, security and privacy concerns of performing an evaluation and management service by telemedicine and the availability of in-person appointments. I also discussed with the patient that there may be a patient responsible charge related to this service. The patient expressed understanding and agreed to proceed. ? ?Other persons participating in the visit and their role in the encounter: Patient's daughter-in-law ? ?Diagnosis: JAK2 positive polycythemia vera ? ?Patient's location:  home ?Provider's location:  work ? ?Chief Complaint: Routine follow-up of P vera on Hydrea ? ?History of present illness: Patient is a 88 year old female who was diagnosed with JAK2 positive polycythemia vera back in 2018 in [place].  Patient states that she has had multiple episodes of phlebotomy back then but has not required phlebotomy now for a long time. JAK2 revealed V617F mutation on 09/15/2016. Factor V Leiden and prothrombin gene mutation were negative on 09/12/2016. Bone marrow on 11/03/2016 showed first regular bone marrow with panmyelosis and some lymphoid aggregates. Special stains were not diagnostic of lymphoproliferative disease morphologically. Flow cytometry showed small population (1.2%) of monoclonal B cells.  ?  ?She also has a history of stage I right breast cancer in 2012 and has completed lumpectomy radiation treatment as well as adjuvant hormone therapy.  History of PE in 2018 possibly secondary to anastrozole versus P vera for which she was on Pradaxa followed by Xarelto.  Xarelto was then discontinued due to diverticular bleed in 2019 and patient has not had any thrombotic episodes since then. ? ?Interval history tolerating Hydrea well without any significant side effects.  No recent episodes of  thrombosis ? ? ?Review of Systems  ?Constitutional:  Positive for malaise/fatigue. Negative for chills, fever and weight loss.  ?HENT:  Negative for congestion, ear discharge and nosebleeds.   ?Eyes:  Negative for blurred vision.  ?Respiratory:  Negative for cough, hemoptysis, sputum production, shortness of breath and wheezing.   ?Cardiovascular:  Negative for chest pain, palpitations, orthopnea and claudication.  ?Gastrointestinal:  Negative for abdominal pain, blood in stool, constipation, diarrhea, heartburn, melena, nausea and vomiting.  ?Genitourinary:  Negative for dysuria, flank pain, frequency, hematuria and urgency.  ?Musculoskeletal:  Negative for back pain, joint pain and myalgias.  ?Skin:  Negative for rash.  ?Neurological:  Negative for dizziness, tingling, focal weakness, seizures, weakness and headaches.  ?Endo/Heme/Allergies:  Does not bruise/bleed easily.  ?Psychiatric/Behavioral:  Negative for depression and suicidal ideas. The patient does not have insomnia.   ? ?Allergies  ?Allergen Reactions  ? Shellfish Allergy Anaphylaxis  ? Amoxicillin   ? Codeine   ? Iodine   ? Morphine And Related   ? ? ?Past Medical History:  ?Diagnosis Date  ? Asthma   ? Breast cancer ([place])   ? right side  ? Bronchitis   ? Pulmonary embolism ([place])   ? ? ?Past Surgical History:  ?Procedure Laterality Date  ? BREAST LUMPECTOMY    ? right side  ? CHOLECYSTECTOMY    ? TONSILLECTOMY    ? ? ?Social History  ? ?Socioeconomic History  ? Marital status: Married  ?  Spouse name: Not on file  ? Number of children: Not on file  ? Years of education: Not on file  ? Highest education level: Not on file  ?Occupational History  ? Not on file  ?Tobacco  Use  ? Smoking status: Former  ? Smokeless tobacco: Never  ?Vaping Use  ? Vaping Use: Never used  ?Substance and Sexual Activity  ? Alcohol use: No  ? Drug use: No  ? Sexual activity: Not Currently  ?Other Topics Concern  ? Not on file  ?Social History Narrative  ? Not on file  ? ?Social  Determinants of Health  ? ?Financial Resource Strain: Not on file  ?Food Insecurity: Not on file  ?Transportation Needs: Not on file  ?Physical Activity: Not on file  ?Stress: Not on file  ?Social Connections: Not on file  ?Intimate Partner Violence: Not on file  ? ? ?Family History  ?Problem Relation Age of Onset  ? Diabetes Mother   ? Heart disease Father   ? ? ? ?Current Outpatient Medications:  ?  acetaminophen (TYLENOL) 500 MG tablet, Take by mouth., Disp: , Rfl:  ?  albuterol (VENTOLIN HFA) 108 (90 Base) MCG/ACT inhaler, Ventolin HFA 90 mcg/actuation aerosol inhaler  INHALE 2 INHALATIONS INTO THE LUNGS EVERY 4 HOURS AS NEEDED FOR WHEEZING OR SHORTNESS OF BREATH, Disp: , Rfl:  ?  aspirin 81 MG EC tablet, Take by mouth., Disp: , Rfl:  ?  cyanocobalamin 1000 MCG tablet, Take by mouth., Disp: , Rfl:  ?  desonide (DESOWEN) 0.05 % cream, Apply topically., Disp: , Rfl:  ?  loratadine (CLARITIN) 10 MG tablet, Take 10 mg by mouth daily., Disp: , Rfl:  ?  loratadine (CLARITIN) 10 MG tablet, Take by mouth., Disp: , Rfl:  ?  Magnesium 250 MG TABS, Take by mouth., Disp: , Rfl:  ?  OMEPRAZOLE PO, Take by mouth., Disp: , Rfl:  ?  benzonatate (TESSALON) 100 MG capsule, Take 2 capsules (200 mg total) by mouth every 8 (eight) hours. (Patient not taking: Reported on 03/27/2021), Disp: 21 capsule, Rfl: 0 ?  hydroxyurea (HYDREA) 500 MG capsule, TAKE ONE CAPSULE BY MOUTH WITH FOOD ON MONDAY, TUESDAY, THURSDAY AND FRIDAY EVERY WEEK TO MINIMIZE GI SIDE EFFECTS., Disp: 51 capsule, Rfl: 2 ?  ipratropium-albuterol (DUONEB) 0.5-2.5 (3) MG/3ML SOLN, Inhale into the lungs. (Patient not taking: Reported on 09/25/2021), Disp: , Rfl:  ?  predniSONE (DELTASONE) 20 MG tablet, Take 20 mg by mouth daily. (Patient not taking: Reported on 03/27/2021), Disp: , Rfl:  ? ?No results found. ? ?No images are attached to the encounter. ? ? ? ?  Latest Ref Rng & Units 03/27/2021  ?  1:46 PM  ?CMP  ?Glucose 70 - 99 mg/dL 98    ?BUN 8 - 23 mg/dL 22     ?Creatinine 0.44 - 1.00 mg/dL 1.04    ?Sodium 135 - 145 mmol/L 134    ?Potassium 3.5 - 5.1 mmol/L 4.5    ?Chloride 98 - 111 mmol/L 102    ?CO2 22 - 32 mmol/L 26    ?Calcium 8.9 - 10.3 mg/dL 9.8    ?Total Protein 6.5 - 8.1 g/dL 6.7    ?Total Bilirubin 0.3 - 1.2 mg/dL 0.7    ?Alkaline Phos 38 - 126 U/L 82    ?AST 15 - 41 U/L 16    ?ALT 0 - 44 U/L 11    ? ? ?  Latest Ref Rng & Units 09/24/2021  ?  1:19 PM  ?CBC  ?WBC 4.0 - 10.5 K/uL 6.0    ?Hemoglobin 12.0 - 15.0 g/dL 12.7    ?Hematocrit 36.0 - 46.0 % 39.9    ?Platelets 150 - 400 K/uL 461    ? ? ? ?  Observation/objective: Appears in no acute distress over video visit today.  Breathing is nonlabored ? ?Assessment and plan: Patient is a 88 year old female with JAK2 positive MPN likely P vera here for routine follow-up ? ?Patient's hematocrit is currently at target 39.9 with a goal of less than 42 and she does not require phlebotomy at this time.  She was diagnosed with JAK2 positive P vera and has undergone phlebotomies in the past.  Her platelet counts have been in the mid 400s and [place] when around this range.  Since we are treating more of the P vera and not ET I am not inclined to increase her dose of Hydrea at this time especially given her age I am concerned about worsening anemia and white cell count if increase Hydrea dosing.  She will continue with Hydrea at 500 mg daily ? ?Follow-up instructions: CBC with differential and CMP in 3 in 6 months and I will see her back in 6 months ? ?I discussed the assessment and treatment plan with the patient. The patient was provided an opportunity to ask questions and all were answered. The patient agreed with the plan and demonstrated an understanding of the instructions. ?  ?The patient was advised to call back or seek an in-person evaluation if the symptoms worsen or if the condition fails to improve as anticipated. ? ?Visit Diagnosis: ?1. Polycythemia vera ([place])   ?2. History of breast cancer   ?3. High risk medication use    ? ? ?Dr. Randa Evens, MD, MPH ?[facility] at [facility] ?Tel- [phone] ?09/26/2021 ?9:26 AM ? ?

## 2021-12-26 ENCOUNTER — Telehealth: Payer: Self-pay | Admitting: Oncology

## 2021-12-26 NOTE — Telephone Encounter (Signed)
pt daughter called in to be scheduled for appts.Marland KitchenMarland KitchenKJ

## 2021-12-30 ENCOUNTER — Inpatient Hospital Stay: Payer: Medicare Other | Attending: Oncology

## 2021-12-30 DIAGNOSIS — D45 Polycythemia vera: Secondary | ICD-10-CM | POA: Insufficient documentation

## 2021-12-30 DIAGNOSIS — Z853 Personal history of malignant neoplasm of breast: Secondary | ICD-10-CM

## 2021-12-30 LAB — CBC WITH DIFFERENTIAL/PLATELET
Abs Immature Granulocytes: 0.01 10*3/uL (ref 0.00–0.07)
Basophils Absolute: 0.1 10*3/uL (ref 0.0–0.1)
Basophils Relative: 1 %
Eosinophils Absolute: 0.1 10*3/uL (ref 0.0–0.5)
Eosinophils Relative: 3 %
HCT: 39 % (ref 36.0–46.0)
Hemoglobin: 12.7 g/dL (ref 12.0–15.0)
Immature Granulocytes: 0 %
Lymphocytes Relative: 20 %
Lymphs Abs: 1.1 10*3/uL (ref 0.7–4.0)
MCH: 32 pg (ref 26.0–34.0)
MCHC: 32.6 g/dL (ref 30.0–36.0)
MCV: 98.2 fL (ref 80.0–100.0)
Monocytes Absolute: 0.4 10*3/uL (ref 0.1–1.0)
Monocytes Relative: 8 %
Neutro Abs: 3.6 10*3/uL (ref 1.7–7.7)
Neutrophils Relative %: 68 %
Platelets: 521 10*3/uL — ABNORMAL HIGH (ref 150–400)
RBC: 3.97 MIL/uL (ref 3.87–5.11)
RDW: 14.6 % (ref 11.5–15.5)
WBC: 5.3 10*3/uL (ref 4.0–10.5)
nRBC: 0 % (ref 0.0–0.2)

## 2021-12-30 LAB — COMPREHENSIVE METABOLIC PANEL
ALT: 12 U/L (ref 0–44)
AST: 18 U/L (ref 15–41)
Albumin: 3.9 g/dL (ref 3.5–5.0)
Alkaline Phosphatase: 73 U/L (ref 38–126)
Anion gap: 7 (ref 5–15)
BUN: 25 mg/dL — ABNORMAL HIGH (ref 8–23)
CO2: 26 mmol/L (ref 22–32)
Calcium: 9.8 mg/dL (ref 8.9–10.3)
Chloride: 103 mmol/L (ref 98–111)
Creatinine, Ser: 1.02 mg/dL — ABNORMAL HIGH (ref 0.44–1.00)
GFR, Estimated: 52 mL/min — ABNORMAL LOW (ref >60)
Glucose, Bld: 97 mg/dL (ref 70–99)
Potassium: 5 mmol/L (ref 3.5–5.1)
Sodium: 136 mmol/L (ref 135–145)
Total Bilirubin: 0.4 mg/dL (ref 0.3–1.2)
Total Protein: 6.6 g/dL (ref 6.5–8.1)

## 2022-03-28 ENCOUNTER — Encounter: Payer: Self-pay | Admitting: Oncology

## 2022-03-28 ENCOUNTER — Inpatient Hospital Stay: Payer: Medicare Other | Admitting: Oncology

## 2022-03-28 ENCOUNTER — Inpatient Hospital Stay: Payer: Medicare Other | Attending: Oncology

## 2022-03-28 VITALS — Temp 98.4°F | Resp 16 | Wt 144.5 lb

## 2022-03-28 DIAGNOSIS — D45 Polycythemia vera: Secondary | ICD-10-CM

## 2022-03-28 DIAGNOSIS — Z853 Personal history of malignant neoplasm of breast: Secondary | ICD-10-CM

## 2022-03-28 DIAGNOSIS — Z79899 Other long term (current) drug therapy: Secondary | ICD-10-CM | POA: Insufficient documentation

## 2022-03-28 DIAGNOSIS — E875 Hyperkalemia: Secondary | ICD-10-CM | POA: Diagnosis not present

## 2022-03-28 LAB — CBC WITH DIFFERENTIAL/PLATELET
Abs Immature Granulocytes: 0.01 10*3/uL (ref 0.00–0.07)
Basophils Absolute: 0.1 10*3/uL (ref 0.0–0.1)
Basophils Relative: 1 %
Eosinophils Absolute: 0.1 10*3/uL (ref 0.0–0.5)
Eosinophils Relative: 2 %
HCT: 38.6 % (ref 36.0–46.0)
Hemoglobin: 12.8 g/dL (ref 12.0–15.0)
Immature Granulocytes: 0 %
Lymphocytes Relative: 22 %
Lymphs Abs: 1.3 10*3/uL (ref 0.7–4.0)
MCH: 32.2 pg (ref 26.0–34.0)
MCHC: 33.2 g/dL (ref 30.0–36.0)
MCV: 97.2 fL (ref 80.0–100.0)
Monocytes Absolute: 0.5 10*3/uL (ref 0.1–1.0)
Monocytes Relative: 8 %
Neutro Abs: 4.1 10*3/uL (ref 1.7–7.7)
Neutrophils Relative %: 67 %
Platelets: 500 10*3/uL — ABNORMAL HIGH (ref 150–400)
RBC: 3.97 MIL/uL (ref 3.87–5.11)
RDW: 14.5 % (ref 11.5–15.5)
WBC: 6 10*3/uL (ref 4.0–10.5)
nRBC: 0 % (ref 0.0–0.2)

## 2022-03-28 LAB — COMPREHENSIVE METABOLIC PANEL
ALT: 12 U/L (ref 0–44)
AST: 22 U/L (ref 15–41)
Albumin: 3.8 g/dL (ref 3.5–5.0)
Alkaline Phosphatase: 75 U/L (ref 38–126)
Anion gap: 3 — ABNORMAL LOW (ref 5–15)
BUN: 21 mg/dL (ref 8–23)
CO2: 26 mmol/L (ref 22–32)
Calcium: 9.8 mg/dL (ref 8.9–10.3)
Chloride: 107 mmol/L (ref 98–111)
Creatinine, Ser: 1.13 mg/dL — ABNORMAL HIGH (ref 0.44–1.00)
GFR, Estimated: 45 mL/min — ABNORMAL LOW (ref >60)
Glucose, Bld: 91 mg/dL (ref 70–99)
Potassium: 5.3 mmol/L — ABNORMAL HIGH (ref 3.5–5.1)
Sodium: 136 mmol/L (ref 135–145)
Total Bilirubin: 0.5 mg/dL (ref 0.3–1.2)
Total Protein: 6.6 g/dL (ref 6.5–8.1)

## 2022-03-28 MED ORDER — HYDROXYUREA 500 MG PO CAPS: ORAL_CAPSULE | ORAL | 2 refills | Status: DC

## 2022-03-28 NOTE — Progress Notes (Signed)
Pt states will like to know if she should continue taking vitamin b12 or magnesium. Daughter states pt had a mini stroke about 2-3 weeks ago. 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 Pt stays she is cold all the time and constantly tired will like to discuss if she can take multi-vitamin. Per daughter pt has some lower bilateral swelling and has made an appt with cardiology for next week.

## 2022-04-04 NOTE — Progress Notes (Signed)
Hematology/Oncology Consult note [facility]  [phone] [phone]  Patient Care Team: Ezequiel Kayser, MD as PCP - General (Internal Medicine) Sindy Guadeloupe, MD as Consulting Physician (Oncology)   Name of the patient: Summer Green  [id number]  1926/09/05   Date of visit: 04/04/22  Diagnosis- JAK2 positive polycythemia vera  Chief complaint/ Reason for visit-routine follow-up of P vera on Hydrea  Heme/Onc history:  Patient is a 88 year old female who was diagnosed with JAK2 positive polycythemia vera back in 2018 in [place].  Patient states that she has had multiple episodes of phlebotomy back then but has not required phlebotomy now for a long time. JAK2 revealed V617F mutation on 09/15/2016. Factor V Leiden and prothrombin gene mutation were negative on 09/12/2016. Bone marrow on 11/03/2016 showed first regular bone marrow with panmyelosis and some lymphoid aggregates. Special stains were not diagnostic of lymphoproliferative disease morphologically. Flow cytometry showed small population (1.2%) of monoclonal B cells.    She also has a history of stage I right breast cancer in 2012 and has completed lumpectomy radiation treatment as well as adjuvant hormone therapy.  History of PE in 2018 possibly secondary to anastrozole versus P vera for which she was on Pradaxa followed by Xarelto.  Xarelto was then discontinued due to diverticular bleed in 2019 and patient has not had any thrombotic episodes since then.  Interval history-patient had a transientEpisode of amaurosis fugax which resolved on its own.  She is otherwise tolerating Hydrea well without any significant side effects.  She is doing well for her age and remains independent of her ADLs and IADLs.  ECOG PS- 1 Pain scale- 0   Review of systems- Review of Systems  Constitutional:  Negative for chills, fever, malaise/fatigue and weight loss.  HENT:  Negative for congestion, ear  discharge and nosebleeds.   Eyes:  Negative for blurred vision.  Respiratory:  Negative for cough, hemoptysis, sputum production, shortness of breath and wheezing.   Cardiovascular:  Negative for chest pain, palpitations, orthopnea and claudication.  Gastrointestinal:  Negative for abdominal pain, blood in stool, constipation, diarrhea, heartburn, melena, nausea and vomiting.  Genitourinary:  Negative for dysuria, flank pain, frequency, hematuria and urgency.  Musculoskeletal:  Negative for back pain, joint pain and myalgias.  Skin:  Negative for rash.  Neurological:  Negative for dizziness, tingling, focal weakness, seizures, weakness and headaches.  Endo/Heme/Allergies:  Does not bruise/bleed easily.  Psychiatric/Behavioral:  Negative for depression and suicidal ideas. The patient does not have insomnia.       Allergies  Allergen Reactions   Shellfish Allergy Anaphylaxis   Amoxicillin    Codeine    Iodine    Morphine And Related      Past Medical History:  Diagnosis Date   Asthma    Breast cancer ([place])    right side   Bronchitis    Pulmonary embolism ([facility])      Past Surgical History:  Procedure Laterality Date   BREAST LUMPECTOMY     right side   CHOLECYSTECTOMY     TONSILLECTOMY      Social History   Socioeconomic History   Marital status: Married    Spouse name: Not on file   Number of children: Not on file   Years of education: Not on file   Highest education level: Not on file  Occupational History   Not on file  Tobacco Use   Smoking status: Former   Smokeless tobacco: Never  Vaping  Use   Vaping Use: Never used  Substance and Sexual Activity   Alcohol use: No   Drug use: No   Sexual activity: Not Currently  Other Topics Concern   Not on file  Social History Narrative   Not on file   Social Determinants of Health   Financial Resource Strain: Not on file  Food Insecurity: Not on file  Transportation Needs: Not on file  Physical Activity: Not  on file  Stress: Not on file  Social Connections: Not on file  Intimate Partner Violence: Not on file    Family History  Problem Relation Age of Onset   Diabetes Mother    Heart disease Father      Current Outpatient Medications:    acetaminophen (TYLENOL) 500 MG tablet, Take by mouth., Disp: , Rfl:    albuterol (VENTOLIN HFA) 108 (90 Base) MCG/ACT inhaler, Ventolin HFA 90 mcg/actuation aerosol inhaler  INHALE 2 INHALATIONS INTO THE LUNGS EVERY 4 HOURS AS NEEDED FOR WHEEZING OR SHORTNESS OF BREATH, Disp: , Rfl:    loratadine (CLARITIN) 10 MG tablet, Take by mouth., Disp: , Rfl:    omeprazole (PRILOSEC) 10 MG capsule, Take 1 capsule by mouth daily., Disp: , Rfl:    aspirin 81 MG EC tablet, Take by mouth., Disp: , Rfl:    cyanocobalamin 1000 MCG tablet, Take by mouth., Disp: , Rfl:    desonide (DESOWEN) 0.05 % cream, Apply topically., Disp: , Rfl:    hydroxyurea (HYDREA) 500 MG capsule, TAKE ONE CAPSULE BY MOUTH WITH FOOD ON MONDAY, TUESDAY, Wednesday,Thursday AND FRIDAY EVERY WEEK TO MINIMIZE GI SIDE EFFECTS., Disp: 20 capsule, Rfl: 2   ipratropium-albuterol (DUONEB) 0.5-2.5 (3) MG/3ML SOLN, Inhale into the lungs. (Patient not taking: Reported on 09/25/2021), Disp: , Rfl:    Magnesium 250 MG TABS, Take by mouth. (Patient not taking: Reported on 03/28/2022), Disp: , Rfl:    predniSONE (DELTASONE) 20 MG tablet, Take 20 mg by mouth daily. (Patient not taking: Reported on 03/27/2021), Disp: , Rfl:   Physical exam:  Vitals:   03/28/22 1421  Resp: 16  Temp: 98.4 F (36.9 C)  Weight: 144 lb 8 oz (65.5 kg)   Physical Exam Cardiovascular:     Rate and Rhythm: Normal rate and regular rhythm.     Heart sounds: Normal heart sounds.  Pulmonary:     Effort: Pulmonary effort is normal.     Breath sounds: Normal breath sounds.  Abdominal:     General: Bowel sounds are normal.     Palpations: Abdomen is soft.  Skin:    General: Skin is warm and dry.  Neurological:     Mental Status: She is  alert and oriented to person, place, and time.         Latest Ref Rng & Units 03/28/2022    1:52 PM  CMP  Glucose 70 - 99 mg/dL 91   BUN 8 - 23 mg/dL 21   Creatinine 0.44 - 1.00 mg/dL 1.13   Sodium 135 - 145 mmol/L 136   Potassium 3.5 - 5.1 mmol/L 5.3   Chloride 98 - 111 mmol/L 107   CO2 22 - 32 mmol/L 26   Calcium 8.9 - 10.3 mg/dL 9.8   Total Protein 6.5 - 8.1 g/dL 6.6   Total Bilirubin 0.3 - 1.2 mg/dL 0.5   Alkaline Phos 38 - 126 U/L 75   AST 15 - 41 U/L 22   ALT 0 - 44 U/L 12  Latest Ref Rng & Units 03/28/2022    1:52 PM  CBC  WBC 4.0 - 10.5 K/uL 6.0   Hemoglobin 12.0 - 15.0 g/dL 12.8   Hematocrit 36.0 - 46.0 % 38.6   Platelets 150 - 400 K/uL 500     Assessment and plan- Patient is a 88 y.o. female with history of polycythemia vera on Hydrea here for routine follow-up  Patient is on Hydrea for P vera.  She was never diagnosed with essential thrombocytosis although her platelet counts have been mostly in the 400s to 500s at least dating back to 2016 with no clear rising trend.  Her hematocrit presently is at goal less than 42.  However given her recent episode of amaurosis fugax I will attempt to increase her Hydrea to 500 mg 4 times a week and see if we can bring her platelet count to less than 400.  However if that worsens her cytopenias and I will go back to her old dosing.  Patient also has a mildly increased potassium of 5.3 and I would like her to come back for a repeat lab 2 to 3 weeks from now.  I will see her back in 6 months with labs   Visit Diagnosis 1. Polycythemia vera ([place])   2. High risk medication use      Dr. Randa Evens, MD, MPH [facility] [phone] 04/04/2022 8:30 AM

## 2022-04-11 ENCOUNTER — Other Ambulatory Visit: Payer: Self-pay | Admitting: Nurse Practitioner

## 2022-04-11 ENCOUNTER — Inpatient Hospital Stay: Payer: Medicare Other | Attending: Oncology

## 2022-04-11 DIAGNOSIS — R911 Solitary pulmonary nodule: Secondary | ICD-10-CM

## 2022-04-14 ENCOUNTER — Other Ambulatory Visit: Payer: Self-pay

## 2022-04-14 DIAGNOSIS — D45 Polycythemia vera: Secondary | ICD-10-CM

## 2022-04-21 ENCOUNTER — Inpatient Hospital Stay: Payer: Medicare Other

## 2022-04-21 ENCOUNTER — Encounter: Payer: Self-pay | Admitting: Emergency Medicine

## 2022-04-21 ENCOUNTER — Ambulatory Visit
Admission: EM | Admit: 2022-04-21 | Discharge: 2022-04-21 | Disposition: A | Discharge status: Home / Self Care | Payer: Medicare Other | Attending: Urgent Care | Admitting: Urgent Care

## 2022-04-21 ENCOUNTER — Other Ambulatory Visit: Payer: Self-pay

## 2022-04-21 DIAGNOSIS — J209 Acute bronchitis, unspecified: Secondary | ICD-10-CM

## 2022-04-21 MED ORDER — ALBUTEROL SULFATE (2.5 MG/3ML) 0.083% IN NEBU
2.5000 mg | INHALATION_SOLUTION | Freq: Once | RESPIRATORY_TRACT | Status: AC
  Administered 2022-04-21: 2.5 mg via RESPIRATORY_TRACT

## 2022-04-21 MED ORDER — ALBUTEROL SULFATE (2.5 MG/3ML) 0.083% IN NEBU: 2.5000 mg | INHALATION_SOLUTION | RESPIRATORY_TRACT | 0 refills | Status: AC | PRN

## 2022-04-21 NOTE — ED Triage Notes (Signed)
Cough, congestion for one week

## 2022-04-21 NOTE — ED Provider Notes (Signed)
[facility]    CSN: [id number] Arrival date & time: 04/21/22  1502      History   Chief Complaint Chief Complaint  Patient presents with   Cough    HPI Summer Green is a 88 y.o. female.   Pleasant 88 year old female presents today due to concerns of a cough.  Patient lives with her son, states that he was sick with URI symptoms about a week or 2 ago.  For the past 1 week, she has had a relatively dry nagging cough.  She was given a few doses of Mucinex, and states that seemed to break up the cough, but she is unable to tolerate due to diarrhea.  She does have a history of bronchitis in the past, also has a history of PE several years ago.  She denies any significant shortness of breath or chest pain.  She reports URI symptoms including postnasal drainage and rhinorrhea.  Also has a history of asthma.  Does have a rescue inhaler for which she has been taking 4 times daily.  States that this does significantly help with her symptoms.   Cough   Past Medical History:  Diagnosis Date   Asthma    Breast cancer ([place])    right side   Bronchitis    Pulmonary embolism [facility])     Patient Active Problem List   Diagnosis Date Noted   History of breast cancer 10/04/2020   B12 deficiency 10/04/2020   Vitamin D deficiency 10/04/2020   Polycythemia vera ([place]) 09/05/2020    Past Surgical History:  Procedure Laterality Date   BREAST LUMPECTOMY     right side   CHOLECYSTECTOMY     TONSILLECTOMY      OB History   No obstetric history on file.      Home Medications    Prior to Admission medications   Medication Sig Start Date End Date Taking? Authorizing Provider  albuterol (PROVENTIL) (2.5 MG/3ML) 0.083% nebulizer solution Take 3 mLs (2.5 mg total) by nebulization every 4 (four) hours as needed for wheezing or shortness of breath. 04/21/22  Yes Carrissa Taitano L, PA  acetaminophen (TYLENOL) 500 MG tablet Take by mouth.    [provider]  albuterol  (VENTOLIN HFA) 108 (90 Base) MCG/ACT inhaler Ventolin HFA 90 mcg/actuation aerosol inhaler  INHALE 2 INHALATIONS INTO THE LUNGS EVERY 4 HOURS AS NEEDED FOR WHEEZING OR SHORTNESS OF BREATH    [provider]  aspirin 81 MG EC tablet Take by mouth.    [provider]  cyanocobalamin 1000 MCG tablet Take by mouth.    [provider]  desonide (DESOWEN) 0.05 % cream Apply topically. 12/20/19   [provider]  hydroxyurea (HYDREA) 500 MG capsule TAKE ONE CAPSULE BY MOUTH WITH FOOD ON MONDAY, TUESDAY, Wednesday,Thursday AND FRIDAY EVERY WEEK TO MINIMIZE GI SIDE EFFECTS. 03/28/22   Sindy Guadeloupe, MD  loratadine (CLARITIN) 10 MG tablet Take by mouth.    [provider]  omeprazole (PRILOSEC) 10 MG capsule Take 1 capsule by mouth daily.    [provider]    Family History Family History  Problem Relation Age of Onset   Diabetes Mother    Heart disease Father     Social History Social History   Tobacco Use   Smoking status: Former   Smokeless tobacco: Never  Scientific laboratory technician Use: Never used  Substance Use Topics   Alcohol use: No   Drug use: No  Allergies   Shellfish allergy, Amoxicillin, Codeine, Iodine, and Morphine and related   Review of Systems Review of Systems  Respiratory:  Positive for cough.   As per HPI   Physical Exam Triage Vital Signs ED Triage Vitals  Enc Vitals Group     BP 04/21/22 1600 (!) 127/92     Pulse Rate 04/21/22 1600 79     Resp 04/21/22 1600 20     Temp 04/21/22 1600 98.4 F (36.9 C)     Temp Source 04/21/22 1600 Oral     SpO2 04/21/22 1600 97 %     Weight --      Height --      Head Circumference --      Peak Flow --      Pain Score 04/21/22 1557 0     Pain Loc --      Pain Edu? --      Excl. in [place]? --    No data found.  Updated Vital Signs BP (!) 127/92 (BP Location: Left Arm)   Pulse 79   Temp 98.4 F (36.9 C) (Oral)   Resp 20   SpO2 97%   Visual Acuity Right Eye  Distance:   Left Eye Distance:   Bilateral Distance:    Right Eye Near:   Left Eye Near:    Bilateral Near:     Physical Exam Vitals and nursing note reviewed. Exam conducted with a chaperone present.  Constitutional:      General: She is not in acute distress.    Appearance: Normal appearance. She is well-developed and normal weight. She is not ill-appearing, toxic-appearing or diaphoretic.  HENT:     Head: Normocephalic and atraumatic.     Right Ear: Tympanic membrane, ear canal and external ear normal. There is no impacted cerumen.     Left Ear: Tympanic membrane, ear canal and external ear normal. There is no impacted cerumen.     Nose: Nose normal. No congestion or rhinorrhea.     Mouth/Throat:     Mouth: Mucous membranes are moist.     Pharynx: Oropharynx is clear. No oropharyngeal exudate or posterior oropharyngeal erythema.  Eyes:     Extraocular Movements: Extraocular movements intact.     Conjunctiva/sclera: Conjunctivae normal.     Pupils: Pupils are equal, round, and reactive to light.  Cardiovascular:     Rate and Rhythm: Normal rate and regular rhythm.     Heart sounds: No murmur heard. Pulmonary:     Effort: Pulmonary effort is normal. No respiratory distress.     Breath sounds: No stridor. Wheezing (generalized wheezing all lung fields, significantly improved s/p neb treatment) present. No rhonchi or rales.  Chest:     Chest wall: No tenderness.  Abdominal:     Palpations: Abdomen is soft.     Tenderness: There is no abdominal tenderness.  Musculoskeletal:        General: No swelling.     Cervical back: Neck supple.  Skin:    General: Skin is warm and dry.     Capillary Refill: Capillary refill takes 2 to 3 seconds.     Findings: No erythema or rash.     Comments: Telangiectasias and abrasion to nasal tip  Neurological:     General: No focal deficit present.     Mental Status: She is alert and oriented to person, place, and time.  Psychiatric:         Mood and Affect: Mood normal.  UC Treatments / Results  Labs (all labs ordered are listed, but only abnormal results are displayed) Labs Reviewed - No data to display  EKG   Radiology No results found.  Procedures Procedures (including critical care time)  Medications Ordered in UC Medications  albuterol (PROVENTIL) (2.5 MG/3ML) 0.083% nebulizer solution 2.5 mg (2.5 mg Nebulization Given 04/21/22 1641)    Initial Impression / Assessment and Plan / UC Course  I have reviewed the triage vital signs and the nursing notes.  Pertinent labs & imaging results that were available during my care of the patient were reviewed by me and considered in my medical decision making (see chart for details).     Acute bronchitis - appears viral in nature. Pt has hx of asthma. Pt noted to have normal vitals, excellent response to treatment in office. Will send home pt on nebs Q4-6 hours around the clock. Continue mucinex. Increase water intake. Follow up with PCP should sx persist.   Final Clinical Impressions(s) / UC Diagnoses   Final diagnoses:  Acute bronchitis, unspecified organism     Discharge Instructions      Sennie has acute bronchitis, which is swelling and inflammation of the respiratory tract. She had excellent response to a nebulizer in our office.  Please have her take the nebulizer every 4 hours around-the-clock while awake for the first 24 hours. If symptoms improve, extended to every 6 hours around-the-clock while awake. Once improved, switch to handheld albuterol. Continue doing Mucinex 1-2 times daily as tolerated. Increase water intake. Follow-up with your PCP if symptoms persist, or return to clinic if any new or worsening symptoms occurs.      ED Prescriptions     Medication Sig Dispense Auth. Provider   albuterol (PROVENTIL) (2.5 MG/3ML) 0.083% nebulizer solution Take 3 mLs (2.5 mg total) by nebulization every 4 (four) hours as needed for wheezing or  shortness of breath. 75 mL Jacelyn Cuen L, PA      PDMP not reviewed this encounter.   Chaney Malling, [place] 04/21/22 1718

## 2022-04-21 NOTE — Discharge Instructions (Addendum)
Summer Green has acute bronchitis, which is swelling and inflammation of the respiratory tract. She had excellent response to a nebulizer in our office.  Please have her take the nebulizer every 4 hours around-the-clock while awake for the first 24 hours. If symptoms improve, extended to every 6 hours around-the-clock while awake. Once improved, switch to handheld albuterol. Continue doing Mucinex 1-2 times daily as tolerated. Increase water intake. Follow-up with your PCP if symptoms persist, or return to clinic if any new or worsening symptoms occurs.

## 2022-04-25 ENCOUNTER — Ambulatory Visit
Admission: RE | Admit: 2022-04-25 | Discharge: 2022-04-25 | Disposition: A | Discharge status: Home / Self Care | Payer: Medicare Other | Source: Ambulatory Visit | Attending: Oncology | Admitting: Oncology

## 2022-04-25 DIAGNOSIS — D45 Polycythemia vera: Secondary | ICD-10-CM

## 2022-04-29 ENCOUNTER — Other Ambulatory Visit: Payer: Self-pay

## 2022-04-29 DIAGNOSIS — D45 Polycythemia vera: Secondary | ICD-10-CM

## 2022-05-01 ENCOUNTER — Inpatient Hospital Stay: Payer: Medicare Other

## 2022-05-02 ENCOUNTER — Inpatient Hospital Stay: Payer: Medicare Other | Attending: Oncology

## 2022-05-02 ENCOUNTER — Other Ambulatory Visit: Payer: Self-pay | Admitting: *Deleted

## 2022-05-02 DIAGNOSIS — D45 Polycythemia vera: Secondary | ICD-10-CM | POA: Diagnosis not present

## 2022-05-02 LAB — COMPREHENSIVE METABOLIC PANEL
ALT: 16 U/L (ref 0–44)
AST: 24 U/L (ref 15–41)
Albumin: 3.6 g/dL (ref 3.5–5.0)
Alkaline Phosphatase: 74 U/L (ref 38–126)
Anion gap: 7 (ref 5–15)
BUN: 19 mg/dL (ref 8–23)
CO2: 25 mmol/L (ref 22–32)
Calcium: 9.9 mg/dL (ref 8.9–10.3)
Chloride: 102 mmol/L (ref 98–111)
Creatinine, Ser: 1.06 mg/dL — ABNORMAL HIGH (ref 0.44–1.00)
GFR, Estimated: 49 mL/min — ABNORMAL LOW (ref >60)
Glucose, Bld: 102 mg/dL — ABNORMAL HIGH (ref 70–99)
Potassium: 5 mmol/L (ref 3.5–5.1)
Sodium: 134 mmol/L — ABNORMAL LOW (ref 135–145)
Total Bilirubin: 0.4 mg/dL (ref 0.3–1.2)
Total Protein: 6.7 g/dL (ref 6.5–8.1)

## 2022-05-02 LAB — CBC WITH DIFFERENTIAL/PLATELET
Abs Immature Granulocytes: 0.03 10*3/uL (ref 0.00–0.07)
Basophils Absolute: 0.1 10*3/uL (ref 0.0–0.1)
Basophils Relative: 1 %
Eosinophils Absolute: 0.2 10*3/uL (ref 0.0–0.5)
Eosinophils Relative: 2 %
HCT: 37 % (ref 36.0–46.0)
Hemoglobin: 11.9 g/dL — ABNORMAL LOW (ref 12.0–15.0)
Immature Granulocytes: 0 %
Lymphocytes Relative: 14 %
Lymphs Abs: 1 10*3/uL (ref 0.7–4.0)
MCH: 31.6 pg (ref 26.0–34.0)
MCHC: 32.2 g/dL (ref 30.0–36.0)
MCV: 98.1 fL (ref 80.0–100.0)
Monocytes Absolute: 0.4 10*3/uL (ref 0.1–1.0)
Monocytes Relative: 6 %
Neutro Abs: 5.4 10*3/uL (ref 1.7–7.7)
Neutrophils Relative %: 77 %
Platelets: 542 10*3/uL — ABNORMAL HIGH (ref 150–400)
RBC: 3.77 MIL/uL — ABNORMAL LOW (ref 3.87–5.11)
RDW: 14.4 % (ref 11.5–15.5)
WBC: 7 10*3/uL (ref 4.0–10.5)
nRBC: 0 % (ref 0.0–0.2)

## 2022-05-02 MED ORDER — HYDROXYUREA 500 MG PO CAPS: 500.0000 mg | ORAL_CAPSULE | Freq: Every day | ORAL | 2 refills | Status: DC

## 2022-05-05 ENCOUNTER — Telehealth: Payer: Self-pay | Admitting: *Deleted

## 2022-05-05 NOTE — Telephone Encounter (Signed)
I called and spoke to patient on Friday and she gave me her daughter on the phone and then told her that she will be on hydrea  every day now. The platelets increased . Daughter is good with this and put in new rx also

## 2022-05-07 ENCOUNTER — Encounter: Payer: Self-pay | Admitting: Oncology

## 2022-05-07 ENCOUNTER — Inpatient Hospital Stay (HOSPITAL_BASED_OUTPATIENT_CLINIC_OR_DEPARTMENT_OTHER): Payer: Medicare Other | Admitting: Oncology

## 2022-05-07 DIAGNOSIS — R918 Other nonspecific abnormal finding of lung field: Secondary | ICD-10-CM

## 2022-05-07 DIAGNOSIS — D45 Polycythemia vera: Secondary | ICD-10-CM

## 2022-05-09 NOTE — Progress Notes (Signed)
I connected with Summer Green on 05/09/22 at  3:00 PM EST by video enabled telemedicine visit and verified that I am speaking with the correct person using two identifiers.   I discussed the limitations, risks, security and privacy concerns of performing an evaluation and management service by telemedicine and the availability of in-person appointments. I also discussed with the patient that there may be a patient responsible charge related to this service. The patient expressed understanding and agreed to proceed.  Other persons participating in the visit and their role in the encounter: Patient's daughter  Patient's location: Home Provider's location: Work  Risk analyst Complaint: Discuss CT scan results and further management  History of present illness: Patient is a 88 year old female who was diagnosed with JAK2 positive polycythemia vera back in 2018 in [place].  Patient states that she has had multiple episodes of phlebotomy back then but has not required phlebotomy now for a long time. JAK2 revealed V617F mutation on 09/15/2016. Factor V Leiden and prothrombin gene mutation were negative on 09/12/2016. Bone marrow on 11/03/2016 showed first regular bone marrow with panmyelosis and some lymphoid aggregates. Special stains were not diagnostic of lymphoproliferative disease morphologically. Flow cytometry showed small population (1.2%) of monoclonal B cells.    She also has a history of stage I right breast cancer in 2012 and has completed lumpectomy radiation treatment as well as adjuvant hormone therapy.  History of PE in 2018 possibly secondary to anastrozole versus P vera for which she was on Pradaxa followed by Xarelto.  Xarelto was then discontinued due to diverticular bleed in 2019 and patient has not had any thrombotic episodes since then.  Patient was noted toHave a possible lung nodule based on a CTA neck done at [facility] in August 2023 which showed a 1.5 x 0.8 mm nodularity in the right lung  aspects.  This was followed by a CT chest without contrast in October 2023 which showed scattered solid pulmonary nodules in both the lungs with the largest one measuring 0.8 cm in the right middle lobe.  Interval history patient is tolerating Hydrea well and is now taking it at 500 mg 7 days a week.  Denies any significant side effects from it.   Review of Systems  Constitutional:  Negative for chills, fever, malaise/fatigue and weight loss.  HENT:  Negative for congestion, ear discharge and nosebleeds.   Eyes:  Negative for blurred vision.  Respiratory:  Negative for cough, hemoptysis, sputum production, shortness of breath and wheezing.   Cardiovascular:  Negative for chest pain, palpitations, orthopnea and claudication.  Gastrointestinal:  Negative for abdominal pain, blood in stool, constipation, diarrhea, heartburn, melena, nausea and vomiting.  Genitourinary:  Negative for dysuria, flank pain, frequency, hematuria and urgency.  Musculoskeletal:  Negative for back pain, joint pain and myalgias.  Skin:  Negative for rash.  Neurological:  Negative for dizziness, tingling, focal weakness, seizures, weakness and headaches.  Endo/Heme/Allergies:  Does not bruise/bleed easily.  Psychiatric/Behavioral:  Negative for depression and suicidal ideas. The patient does not have insomnia.     Allergies  Allergen Reactions   Other Anaphylaxis   Shellfish Allergy Anaphylaxis   Amoxicillin    Codeine    Iodine    Morphine And Related     Past Medical History:  Diagnosis Date   Asthma    Breast cancer ([place])    right side   Bronchitis    Pulmonary embolism (H[facility])     Past Surgical History:  Procedure Laterality Date  BREAST LUMPECTOMY     right side   CHOLECYSTECTOMY     TONSILLECTOMY      Social History   Socioeconomic History   Marital status: Married    Spouse name: Not on file   Number of children: Not on file   Years of education: Not on file   Highest education level:  Not on file  Occupational History   Not on file  Tobacco Use   Smoking status: Former   Smokeless tobacco: Never  Vaping Use   Vaping Use: Never used  Substance and Sexual Activity   Alcohol use: No   Drug use: No   Sexual activity: Not Currently  Other Topics Concern   Not on file  Social History Narrative   Not on file   Social Determinants of Health   Financial Resource Strain: Not on file  Food Insecurity: Not on file  Transportation Needs: Not on file  Physical Activity: Not on file  Stress: Not on file  Social Connections: Not on file  Intimate Partner Violence: Not on file    Family History  Problem Relation Age of Onset   Diabetes Mother    Heart disease Father      Current Outpatient Medications:    acetaminophen (TYLENOL) 500 MG tablet, Take by mouth., Disp: , Rfl:    albuterol (PROVENTIL) (2.5 MG/3ML) 0.083% nebulizer solution, Take 3 mLs (2.5 mg total) by nebulization every 4 (four) hours as needed for wheezing or shortness of breath., Disp: 75 mL, Rfl: 0   clopidogrel (PLAVIX) 75 MG tablet, , Disp: , Rfl:    desonide (DESOWEN) 0.05 % cream, Apply topically., Disp: , Rfl:    hydroxyurea (HYDREA) 500 MG capsule, Take 1 capsule (500 mg total) by mouth daily. TAKE ONE CAPSULE BY MOUTH WITH FOOD EVERY dAY, Disp: 30 capsule, Rfl: 2   loratadine (CLARITIN) 10 MG tablet, Take by mouth., Disp: , Rfl:    omeprazole (PRILOSEC) 10 MG capsule, Take 1 capsule by mouth daily., Disp: , Rfl:    albuterol (VENTOLIN HFA) 108 (90 Base) MCG/ACT inhaler, Ventolin HFA 90 mcg/actuation aerosol inhaler  INHALE 2 INHALATIONS INTO THE LUNGS EVERY 4 HOURS AS NEEDED FOR WHEEZING OR SHORTNESS OF BREATH, Disp: , Rfl:    aspirin 81 MG EC tablet, Take by mouth. (Patient not taking: Reported on 05/07/2022), Disp: , Rfl:    cyanocobalamin 1000 MCG tablet, Take by mouth. (Patient not taking: Reported on 05/07/2022), Disp: , Rfl:   CT CHEST WO CONTRAST  Result Date: 04/29/2022 CLINICAL DATA:   Polycythemia vera. EXAM: CT CHEST WITHOUT CONTRAST TECHNIQUE: Multidetector CT imaging of the chest was performed following the standard protocol without IV contrast. RADIATION DOSE REDUCTION: This exam was performed according to the departmental dose-optimization program which includes automated exposure control, adjustment of the mA and/or kV according to patient size and/or use of iterative reconstruction technique. COMPARISON:  10/21/2020 chest radiograph. FINDINGS: Cardiovascular: Normal heart size. No significant pericardial effusion/thickening. Three-vessel coronary atherosclerosis. Atherosclerotic thoracic aorta with dilated 4.0 cm ascending thoracic aorta. Normal caliber pulmonary arteries. Mediastinum/Nodes: No significant thyroid nodules. Unremarkable esophagus. Surgical clips in the right axilla. No axillary adenopathy. No pathologically enlarged mediastinal or discrete hilar nodes on these noncontrast images. Lungs/Pleura: No pneumothorax. No pleural effusion. Scattered regions of mild cylindrical bronchiectasis in both lungs, most prominent in the right middle lobe and basilar lower lobes bilaterally. Patchy mild regions of peripheral peribronchovascular consolidation in the medial basilar lower lobes bilaterally. Scattered solid pulmonary nodules in  both lungs, largest 0.8 cm in the medial right middle lobe (series 3/image 111). Mosaic attenuation throughout both lungs. Scattered curvilinear parenchymal bands in both lungs compatible with mild postinfectious/postinflammatory scarring. Upper abdomen: Several simple liver cysts scattered throughout the visualized liver, largest 2.6 cm in the posterior left liver lobe. Cholecystectomy. Musculoskeletal: No aggressive appearing focal osseous lesions. Moderate thoracic spondylosis. IMPRESSION: 1. Scattered solid pulmonary nodules in both lungs, largest 0.8 cm in the medial right middle lobe. Non-contrast chest CT at 3-6 months is recommended. If the nodules  are stable at time of repeat CT, then future CT at 18-24 months (from today's scan) is considered optional for low-risk patients, but is recommended for high-risk patients. This recommendation follows the consensus statement: Guidelines for Management of Incidental Pulmonary Nodules Detected on CT Images: From the Fleischner Society 2017; Radiology 2017; 284:228-243. 2. Scattered mild cylindrical bronchiectasis in both lungs, most prominent in the right middle lobe and basilar lower lobes. 3. Patchy mild regions of peripheral peribronchovascular consolidation in the medial basilar lower lobes bilaterally, compatible with a mild bronchopneumonia such as due to aspiration. 4. Mosaic attenuation throughout both lungs, nonspecific, differential includes air trapping from small airways disease or mosaic perfusion from pulmonary vascular disease. 5. Three-vessel coronary atherosclerosis. 6.  Aortic Atherosclerosis (ICD10-I70.0). Electronically Signed   By: Ilona Sorrel M.D.   On: 04/29/2022 09:21    No images are attached to the encounter.      Latest Ref Rng & Units 05/02/2022    2:03 PM  CMP  Glucose 70 - 99 mg/dL 102   BUN 8 - 23 mg/dL 19   Creatinine 0.44 - 1.00 mg/dL 1.06   Sodium 135 - 145 mmol/L 134   Potassium 3.5 - 5.1 mmol/L 5.0   Chloride 98 - 111 mmol/L 102   CO2 22 - 32 mmol/L 25   Calcium 8.9 - 10.3 mg/dL 9.9   Total Protein 6.5 - 8.1 g/dL 6.7   Total Bilirubin 0.3 - 1.2 mg/dL 0.4   Alkaline Phos 38 - 126 U/L 74   AST 15 - 41 U/L 24   ALT 0 - 44 U/L 16       Latest Ref Rng & Units 05/02/2022    2:03 PM  CBC  WBC 4.0 - 10.5 K/uL 7.0   Hemoglobin 12.0 - 15.0 g/dL 11.9   Hematocrit 36.0 - 46.0 % 37.0   Platelets 150 - 400 K/uL 542      Observation/objective: Appears in no acute distress over video visit today.  Breathing is nonlabored  Assessment and plan: Patient is a 88 year old female and this is a visit to discuss following issues:  Lung nodules: Patient had a dedicated  CT chest without contrast after she was noted to have a right apex lung nodule on the CT a of the neck that was in August 2023.  CT chest shows bilateral lung nodules with the largest one measuring 0.8 mm.  There are 2 small to biopsy at this  time and plan is to repeat another CT in about 4 months from now.  History of P vera on Hydrea: Platelet counts have remained more than 400 and therefore Hydrea was increased to 500 mg daily for 7 days.  CBC with differential in 6 weeks and 12 weeks and I will see her back in 12 weeks  Follow-up instructions: As above  I discussed the assessment and treatment plan with the patient. The patient was provided an opportunity to ask questions and all  were answered. The patient agreed with the plan and demonstrated an understanding of the instructions.   The patient was advised to call back or seek an in-person evaluation if the symptoms worsen or if the condition fails to improve as anticipated.  I provided 12 minutes of face-to-face video visit time during this encounter, and > 50% was spent counseling as documented under my assessment & plan.  Visit Diagnosis: 1. Polycythemia vera ([place])     Dr. Randa Evens, MD, MPH [facility] Tel- [phone] 05/09/2022 12:46 PM

## 2022-05-21 ENCOUNTER — Telehealth: Payer: Medicare Other | Admitting: Oncology

## 2022-06-03 ENCOUNTER — Ambulatory Visit
Admission: EM | Admit: 2022-06-03 | Discharge: 2022-06-03 | Disposition: A | Discharge status: Other Acute Inpatient Hospital | Payer: Medicare Other | Attending: Nurse Practitioner | Admitting: Nurse Practitioner

## 2022-06-03 DIAGNOSIS — R079 Chest pain, unspecified: Secondary | ICD-10-CM | POA: Diagnosis not present

## 2022-06-03 NOTE — ED Triage Notes (Signed)
Pt c/o chest pain, shortness of breath, and neck pain. Started this morning. She states the pain started in her left jaw and went down into her left side of her chest. She states last night she could not breathe. She states she has h/o DVT. Pt states she did have some nausea last night.

## 2022-06-03 NOTE — ED Provider Notes (Signed)
[facility]    CSN: [id number] Arrival date & time: 06/03/22  1515      History   Chief Complaint Chief Complaint  Patient presents with   Chest Pain   Shortness of Breath    HPI Summer Green is a 88 y.o. female presents for evaluation of chest pain. Pt is accompanied by her family. Pt reports last night she developed a left sided CP that radiated to her left neck and to her left shoulder. Pain was constant and severe per pt. She is not able to described what the pain felt like. Associated symptoms include SOB and nausea. Denies vomiting, palpitations, syncope, or diaphoresis. No aggravating or alleviating factors. Pain has improved and at time of evaluation denies CP or SOB. Denies hx of CAD, HTN, or DM. Is on cholesterol medication due to hx of TIA. She has a hx of asthma, PE, polycythemia, and factor V leiden. She takes plavix daily. Denies asa use. Son reports recently had a cardiac workup due to TIA and states it was normal. Reports mother and father had MI in 79's. Pt is a previous smoker from many years ago. She reports had a URI that has resolved. No other concerns at this time.    Chest Pain Associated symptoms: nausea and shortness of breath   Shortness of Breath Associated symptoms: chest pain     Past Medical History:  Diagnosis Date   Asthma    Breast cancer ([place])    right side   Bronchitis    Pulmonary embolism [facility]     Patient Active Problem List   Diagnosis Date Noted   History of breast cancer 10/04/2020   B12 deficiency 10/04/2020   Vitamin D deficiency 10/04/2020   Polycythemia vera ([place]) 09/05/2020    Past Surgical History:  Procedure Laterality Date   BREAST LUMPECTOMY     right side   CHOLECYSTECTOMY     TONSILLECTOMY      OB History   No obstetric history on file.      Home Medications    Prior to Admission medications   Medication Sig Start Date End Date Taking? Authorizing Provider  clopidogrel (PLAVIX) 75 MG  tablet  04/10/22  Yes [provider]  loratadine (CLARITIN) 10 MG tablet Take by mouth.   Yes [provider]  omeprazole (PRILOSEC) 10 MG capsule Take 1 capsule by mouth daily.   Yes [provider]  acetaminophen (TYLENOL) 500 MG tablet Take by mouth.    [provider]  albuterol (PROVENTIL) (2.5 MG/3ML) 0.083% nebulizer solution Take 3 mLs (2.5 mg total) by nebulization every 4 (four) hours as needed for wheezing or shortness of breath. 04/21/22   Crain, Whitney L, PA  albuterol (VENTOLIN HFA) 108 (90 Base) MCG/ACT inhaler Ventolin HFA 90 mcg/actuation aerosol inhaler  INHALE 2 INHALATIONS INTO THE LUNGS EVERY 4 HOURS AS NEEDED FOR WHEEZING OR SHORTNESS OF BREATH    [provider]  aspirin 81 MG EC tablet Take by mouth. Patient not taking: Reported on 05/07/2022    [provider]  cyanocobalamin 1000 MCG tablet Take by mouth. Patient not taking: Reported on 05/07/2022    [provider]  desonide (DESOWEN) 0.05 % cream Apply topically. 12/20/19   [provider]  hydroxyurea (HYDREA) 500 MG capsule Take 1 capsule (500 mg total) by mouth daily. TAKE ONE CAPSULE BY MOUTH WITH FOOD EVERY dAY 05/02/22   Sindy Guadeloupe, MD    Family History Family History  Problem Relation Age of Onset   Diabetes Mother    Heart disease Father     Social History Social History   Tobacco Use   Smoking status: Former   Smokeless tobacco: Never  Scientific laboratory technician Use: Never used  Substance Use Topics   Alcohol use: No   Drug use: No     Allergies   Other, Shellfish allergy, Amoxicillin, Codeine, Iodine, and Morphine and related   Review of Systems Review of Systems  Respiratory:  Positive for shortness of breath.   Cardiovascular:  Positive for chest pain.  Gastrointestinal:  Positive for nausea.     Physical Exam Triage Vital Signs ED Triage Vitals  Enc Vitals Group     BP 06/03/22 1522 (!) 133/91     Pulse Rate  06/03/22 1522 83     Resp 06/03/22 1522 18     Temp 06/03/22 1522 98.5 F (36.9 C)     Temp Source 06/03/22 1522 Oral     SpO2 06/03/22 1522 95 %     Weight 06/03/22 1520 144 lb 6.4 oz (65.5 kg)     Height 06/03/22 1520 '5\' 6"'$  (1.676 m)     Head Circumference --      Peak Flow --      Pain Score 06/03/22 1520 10     Pain Loc --      Pain Edu? --      Excl. in [place]? --    No data found.  Updated Vital Signs BP (!) 133/91 (BP Location: Left Arm)   Pulse 83   Temp 98.5 F (36.9 C) (Oral)   Resp 18   Ht '5\' 6"'$  (1.676 m)   Wt 144 lb 6.4 oz (65.5 kg)   SpO2 95%   BMI 23.31 kg/m   Visual Acuity Right Eye Distance:   Left Eye Distance:   Bilateral Distance:    Right Eye Near:   Left Eye Near:    Bilateral Near:     Physical Exam Vitals and nursing note reviewed.  Constitutional:      General: She is not in acute distress.    Appearance: Normal appearance. She is well-developed. She is not ill-appearing, toxic-appearing or diaphoretic.  HENT:     Head: Normocephalic and atraumatic.  Eyes:     Pupils: Pupils are equal, round, and reactive to light.  Cardiovascular:     Rate and Rhythm: Normal rate and regular rhythm.     Heart sounds: Normal heart sounds.  Pulmonary:     Effort: Pulmonary effort is normal.     Breath sounds: Normal breath sounds.  Chest:     Chest wall: No tenderness.  Skin:    General: Skin is warm and dry.  Neurological:     General: No focal deficit present.     Mental Status: She is alert and oriented to person, place, and time.  Psychiatric:        Mood and Affect: Mood normal.        Behavior: Behavior normal.      UC Treatments / Results  Labs (all labs ordered are listed, but only abnormal results are displayed) Labs Reviewed - No data to display  EKG SR with PAC's and LAFB. No acute ST-T wave changes   Radiology No results found.  Procedures Procedures (including critical care time)  Medications Ordered in UC Medications -  No data to display  Initial Impression / Assessment and Plan / UC Course  I have reviewed the triage vital signs and the nursing notes.  Pertinent labs & imaging results that were available during my care of the patient were reviewed by me and considered in my medical decision making (see chart for details).     Discussed with patient and family her symptoms, EKG, and limitations and abilites of urgent care.  Given her hx of PE, TIA, and clotting disorder, advised ER evaluation for further treatment. Pt denies Cp or SOB at time of exam. Pt is agreeable to go to ER with Son driving to [facility] ER. Pt and family instructed to pull over and call 911 for any worsening symptoms that occur in transit and pt/family verbalized understanding Pt was discharged in stable condition and in NAD Final Clinical Impressions(s) / UC Diagnoses   Final diagnoses:  Chest pain, unspecified type   Discharge Instructions   None    ED Prescriptions   None    PDMP not reviewed this encounter.   Melynda Ripple, NP 06/03/22 1600

## 2022-06-03 NOTE — Discharge Instructions (Addendum)
Pt to ER for further evaluation of symptoms

## 2022-06-16 ENCOUNTER — Inpatient Hospital Stay: Payer: Medicare Other | Attending: Oncology

## 2022-06-16 DIAGNOSIS — D45 Polycythemia vera: Secondary | ICD-10-CM | POA: Insufficient documentation

## 2022-06-26 ENCOUNTER — Inpatient Hospital Stay: Payer: Medicare Other

## 2022-06-26 DIAGNOSIS — D45 Polycythemia vera: Secondary | ICD-10-CM

## 2022-06-26 LAB — CBC WITH DIFFERENTIAL/PLATELET
Abs Immature Granulocytes: 0.03 K/uL (ref 0.00–0.07)
Basophils Absolute: 0 K/uL (ref 0.0–0.1)
Basophils Relative: 1 %
Eosinophils Absolute: 0.1 K/uL (ref 0.0–0.5)
Eosinophils Relative: 1 %
HCT: 32.2 % — ABNORMAL LOW (ref 36.0–46.0)
Hemoglobin: 10.4 g/dL — ABNORMAL LOW (ref 12.0–15.0)
Immature Granulocytes: 0 %
Lymphocytes Relative: 10 %
Lymphs Abs: 0.8 K/uL (ref 0.7–4.0)
MCH: 32.8 pg (ref 26.0–34.0)
MCHC: 32.3 g/dL (ref 30.0–36.0)
MCV: 101.6 fL — ABNORMAL HIGH (ref 80.0–100.0)
Monocytes Absolute: 0.6 K/uL (ref 0.1–1.0)
Monocytes Relative: 8 %
Neutro Abs: 6.3 K/uL (ref 1.7–7.7)
Neutrophils Relative %: 80 %
Platelets: 361 K/uL (ref 150–400)
RBC: 3.17 MIL/uL — ABNORMAL LOW (ref 3.87–5.11)
RDW: 16 % — ABNORMAL HIGH (ref 11.5–15.5)
WBC: 7.8 K/uL (ref 4.0–10.5)
nRBC: 0 % (ref 0.0–0.2)

## 2022-08-12 ENCOUNTER — Ambulatory Visit
Admission: RE | Admit: 2022-08-12 | Discharge: 2022-08-12 | Disposition: A | Discharge status: Home / Self Care | Payer: Medicare Other | Source: Ambulatory Visit | Attending: Oncology | Admitting: Oncology

## 2022-08-12 DIAGNOSIS — D45 Polycythemia vera: Secondary | ICD-10-CM | POA: Insufficient documentation

## 2022-08-15 ENCOUNTER — Inpatient Hospital Stay: Payer: Medicare Other | Attending: Oncology

## 2022-08-15 ENCOUNTER — Inpatient Hospital Stay (HOSPITAL_BASED_OUTPATIENT_CLINIC_OR_DEPARTMENT_OTHER): Payer: Medicare Other | Admitting: Oncology

## 2022-08-15 ENCOUNTER — Encounter: Payer: Self-pay | Admitting: Oncology

## 2022-08-15 VITALS — BP 119/75 | HR 83 | Temp 99.3°F | Ht 66.0 in | Wt 139.9 lb

## 2022-08-15 DIAGNOSIS — Z79899 Other long term (current) drug therapy: Secondary | ICD-10-CM | POA: Insufficient documentation

## 2022-08-15 DIAGNOSIS — R918 Other nonspecific abnormal finding of lung field: Secondary | ICD-10-CM

## 2022-08-15 DIAGNOSIS — Z87891 Personal history of nicotine dependence: Secondary | ICD-10-CM | POA: Diagnosis not present

## 2022-08-15 DIAGNOSIS — D45 Polycythemia vera: Secondary | ICD-10-CM

## 2022-08-15 DIAGNOSIS — Z853 Personal history of malignant neoplasm of breast: Secondary | ICD-10-CM | POA: Diagnosis not present

## 2022-08-15 DIAGNOSIS — Z923 Personal history of irradiation: Secondary | ICD-10-CM | POA: Diagnosis not present

## 2022-08-15 DIAGNOSIS — Z86711 Personal history of pulmonary embolism: Secondary | ICD-10-CM | POA: Insufficient documentation

## 2022-08-15 LAB — CBC WITH DIFFERENTIAL/PLATELET
Abs Immature Granulocytes: 0.01 10*3/uL (ref 0.00–0.07)
Basophils Absolute: 0 10*3/uL (ref 0.0–0.1)
Basophils Relative: 1 %
Eosinophils Absolute: 0.1 10*3/uL (ref 0.0–0.5)
Eosinophils Relative: 2 %
HCT: 40.1 % (ref 36.0–46.0)
Hemoglobin: 12.6 g/dL (ref 12.0–15.0)
Immature Granulocytes: 0 %
Lymphocytes Relative: 16 %
Lymphs Abs: 0.9 10*3/uL (ref 0.7–4.0)
MCH: 33.2 pg (ref 26.0–34.0)
MCHC: 31.4 g/dL (ref 30.0–36.0)
MCV: 105.5 fL — ABNORMAL HIGH (ref 80.0–100.0)
Monocytes Absolute: 0.3 10*3/uL (ref 0.1–1.0)
Monocytes Relative: 5 %
Neutro Abs: 4.2 10*3/uL (ref 1.7–7.7)
Neutrophils Relative %: 76 %
Platelets: 365 10*3/uL (ref 150–400)
RBC: 3.8 MIL/uL — ABNORMAL LOW (ref 3.87–5.11)
RDW: 14.6 % (ref 11.5–15.5)
WBC: 5.5 10*3/uL (ref 4.0–10.5)
nRBC: 0 % (ref 0.0–0.2)

## 2022-08-15 NOTE — Progress Notes (Signed)
No concerns for the provider today.

## 2022-08-17 NOTE — Progress Notes (Signed)
Hematology/Oncology Consult note Summer Green  Telephone:([phone] [phone]  Patient Care Team: Sallee Lange, NP as PCP - General (Internal Medicine) Sindy Guadeloupe, MD as Consulting Physician (Oncology)   Name of the patient: Summer Green  [id number]  1926/08/28   Date of visit: 08/17/22  Diagnosis- 1.  JAK2 positive polycythemia vera 2.  Lung nodules  Chief complaint/ Reason for visit-discuss CT scan results and follow-up of polycythemia vera  Heme/Onc history: Patient is a 89 year old female who was diagnosed with JAK2 positive polycythemia vera back in 2018 in [place].  Patient states that she has had multiple episodes of phlebotomy back then but has not required phlebotomy now for a long time. JAK2 revealed V617F mutation on 09/15/2016. Factor V Leiden and prothrombin gene mutation were negative on 09/12/2016. Bone marrow on 11/03/2016 showed first regular bone marrow with panmyelosis and some lymphoid aggregates. Special stains were not diagnostic of lymphoproliferative disease morphologically. Flow cytometry showed small population (1.2%) of monoclonal B cells.    She also has a history of stage I right breast cancer in 2012 and has completed lumpectomy radiation treatment as well as adjuvant hormone therapy.  History of PE in 2018 possibly secondary to anastrozole versus P vera for which she was on Pradaxa followed by Xarelto.  Xarelto was then discontinued due to diverticular bleed in 2019 and patient has not had any thrombotic episodes since then.   Patient was noted toHave a possible lung nodule based on a CTA neck done at [facility] in August 2023 which showed a 1.5 x 0.8 mm nodularity in the right lung aspects.  This was followed by a CT chest without contrast in October 2023 which showed scattered solid pulmonary nodules in both the lungs with the largest one measuring 0.8 cm in the right middle lobe.  Interval history-patient is  currently on Hydrea 500 mg 7 days a week.  She does report some increasing fatigue after she went up from 5 days a week to 7 days a week.  She is still doing well for her age and denies any other complaints at this time  ECOG PS- 1 Pain scale- 0   Review of systems- Review of Systems  Constitutional:  Positive for malaise/fatigue. Negative for chills, fever and weight loss.  HENT:  Negative for congestion, ear discharge and nosebleeds.   Eyes:  Negative for blurred vision.  Respiratory:  Negative for cough, hemoptysis, sputum production, shortness of breath and wheezing.   Cardiovascular:  Negative for chest pain, palpitations, orthopnea and claudication.  Gastrointestinal:  Negative for abdominal pain, blood in stool, constipation, diarrhea, heartburn, melena, nausea and vomiting.  Genitourinary:  Negative for dysuria, flank pain, frequency, hematuria and urgency.  Musculoskeletal:  Negative for back pain, joint pain and myalgias.  Skin:  Negative for rash.  Neurological:  Negative for dizziness, tingling, focal weakness, seizures, weakness and headaches.  Endo/Heme/Allergies:  Does not bruise/bleed easily.  Psychiatric/Behavioral:  Negative for depression and suicidal ideas. The patient does not have insomnia.       Allergies  Allergen Reactions   Other Anaphylaxis   Shellfish Allergy Anaphylaxis   Amoxicillin    Codeine    Iodine    Morphine And Related      Past Medical History:  Diagnosis Date   Asthma    Breast cancer ([place])    right side   Bronchitis    Pulmonary embolism ([place])      Past Surgical History:  Procedure Laterality Date   BREAST LUMPECTOMY     right side   CHOLECYSTECTOMY     TONSILLECTOMY      Social History   Socioeconomic History   Marital status: Married    Spouse name: Not on file   Number of children: Not on file   Years of education: Not on file   Highest education level: Not on file  Occupational History   Not on file  Tobacco Use    Smoking status: Former   Smokeless tobacco: Never  Vaping Use   Vaping Use: Never used  Substance and Sexual Activity   Alcohol use: No   Drug use: No   Sexual activity: Not Currently  Other Topics Concern   Not on file  Social History Narrative   Not on file   Social Determinants of Health   Financial Resource Strain: Not on file  Food Insecurity: Not on file  Transportation Needs: Not on file  Physical Activity: Not on file  Stress: Not on file  Social Connections: Not on file  Intimate Partner Violence: Not on file    Family History  Problem Relation Age of Onset   Diabetes Mother    Heart disease Father      Current Outpatient Medications:    acetaminophen (TYLENOL) 500 MG tablet, Take by mouth., Disp: , Rfl:    albuterol (PROVENTIL) (2.5 MG/3ML) 0.083% nebulizer solution, Take 3 mLs (2.5 mg total) by nebulization every 4 (four) hours as needed for wheezing or shortness of breath., Disp: 75 mL, Rfl: 0   albuterol (VENTOLIN HFA) 108 (90 Base) MCG/ACT inhaler, Ventolin HFA 90 mcg/actuation aerosol inhaler  INHALE 2 INHALATIONS INTO THE LUNGS EVERY 4 HOURS AS NEEDED FOR WHEEZING OR SHORTNESS OF BREATH, Disp: , Rfl:    aspirin 81 MG EC tablet, Take by mouth., Disp: , Rfl:    clopidogrel (PLAVIX) 75 MG tablet, , Disp: , Rfl:    cyanocobalamin 1000 MCG tablet, Take by mouth., Disp: , Rfl:    desonide (DESOWEN) 0.05 % cream, Apply topically., Disp: , Rfl:    escitalopram (LEXAPRO) 10 MG tablet, Take 10 mg by mouth daily., Disp: , Rfl:    hydroxyurea (HYDREA) 500 MG capsule, Take 1 capsule (500 mg total) by mouth daily. TAKE ONE CAPSULE BY MOUTH WITH FOOD EVERY dAY, Disp: 30 capsule, Rfl: 2   loratadine (CLARITIN) 10 MG tablet, Take by mouth., Disp: , Rfl:    omeprazole (PRILOSEC) 10 MG capsule, Take 1 capsule by mouth daily., Disp: , Rfl:   Physical exam:  Vitals:   08/15/22 1140  BP: 119/75  Pulse: 83  Temp: 99.3 [name]  TempSrc: Tympanic  SpO2: 98%  Weight:  139 lb 14.4 oz (63.5 kg)  Height: 5' 6"$  (1.676 m)   Physical Exam Cardiovascular:     Rate and Rhythm: Normal rate and regular rhythm.     Heart sounds: Normal heart sounds.  Pulmonary:     Effort: Pulmonary effort is normal.     Breath sounds: Normal breath sounds.  Skin:    General: Skin is warm and dry.  Neurological:     Mental Status: She is alert and oriented to person, place, and time.         Latest Ref Rng & Units 05/02/2022    2:03 PM  CMP  Glucose 70 - 99 mg/dL 102   BUN 8 - 23 mg/dL 19   Creatinine 0.44 - 1.00 mg/dL 1.06   Sodium 135 -  145 mmol/L 134   Potassium 3.5 - 5.1 mmol/L 5.0   Chloride 98 - 111 mmol/L 102   CO2 22 - 32 mmol/L 25   Calcium 8.9 - 10.3 mg/dL 9.9   Total Protein 6.5 - 8.1 g/dL 6.7   Total Bilirubin 0.3 - 1.2 mg/dL 0.4   Alkaline Phos 38 - 126 U/L 74   AST 15 - 41 U/L 24   ALT 0 - 44 U/L 16       Latest Ref Rng & Units 08/15/2022   11:16 AM  CBC  WBC 4.0 - 10.5 K/uL 5.5   Hemoglobin 12.0 - 15.0 g/dL 12.6   Hematocrit 36.0 - 46.0 % 40.1   Platelets 150 - 400 K/uL 365     No images are attached to the encounter.  CT Chest Wo Contrast  Result Date: 08/12/2022 CLINICAL DATA:  Polycythemia vera EXAM: CT CHEST WITHOUT CONTRAST TECHNIQUE: Multidetector CT imaging of the chest was performed following the standard protocol without IV contrast. RADIATION DOSE REDUCTION: This exam was performed according to the departmental dose-optimization program which includes automated exposure control, adjustment of the mA and/or kV according to patient size and/or use of iterative reconstruction technique. COMPARISON:  Chest CT dated April 25, 2022 FINDINGS: Cardiovascular: Normal heart size. Trace pericardial effusion. Normal caliber thoracic aorta with severe atherosclerotic disease. Severe three-vessel coronary artery calcifications. Mediastinum/Nodes: Esophagus and thyroid are unremarkable. Stable prominent subcentimeter right lower paratracheal lymph  node measuring 9 mm in short axis on series 2, image 60. No pathologically enlarged lymph nodes seen in the chest. Lungs/Pleura: Central airways are patent. Bilateral mosaic attenuation. Moderate centrilobular emphysema. Stable linear opacity of the right upper lobe, likely due to scarring. Interval resolution of right lower lobe consolidation. Persistent linear opacity of the medial left lung base, likely due to scarring or atelectasis. New clustered small solid pulmonary nodules of the left upper lobe, largest measures 3 mm series 3, image 51. Additional previously seen solid pulmonary nodules are stable. Reference solid nodule of the left lower lobe measuring 4 mm on series 3, image 114. Upper Abdomen: Numerous low-attenuation hepatic lesions which are unchanged when compared with prior exam and likely simple cysts. Musculoskeletal: No chest wall mass or suspicious bone lesions identified. IMPRESSION: 1. Previously seen solid pulmonary nodules are stable, largest measures 8 mm. Additional CT follow-up at 18-24 months (from April 25, 2022 scan) is considered optional for low-risk patients, but is recommended for high-risk patients. This recommendation follows the consensus statement: Guidelines for Management of Incidental Pulmonary Nodules Detected on CT Images: From the Fleischner Society 2017; Radiology 2017; 284:228-243. 2. Interval resolution of right lower lobe consolidation. 3. New clustered small solid pulmonary nodules of the left upper lobe, largest measures 3 mm, likely sequela of infection or aspiration. Recommend attention on follow-up. 4. Bilateral mosaic attenuation, findings can be seen in the setting of small airways disease or pulmonary hypertension. 5. Severe three-vessel coronary artery calcifications. 6.  Aortic Atherosclerosis (ICD10-I70.0). Electronically Signed   By: Yetta Glassman M.D.   On: 08/12/2022 20:38     Assessment and plan- Patient is a 89 y.o. female who is here for  follow-up of following issues  JAK2 positive polycythemia vera: Platelet counts are currently less than 400.  Given that she has had some increasing fatigue after going up on the dose of Hydrea have asked her to decrease it to 500 mg 6 times a week.  We will repeat her labs again in 3 months and  if they are well-controlled we will have her come down to 5 days a week.  At her age we can be a bit flexible with her platelet count control as long as it remains roughly less than 500.  Patient is agreeable with the plan.  Lung nodule's: I have reviewed her recent CT chest images independently and discussed findings with the patient which shows overall stable size of previously seen subcentimeter lung nodules mainly in her right and left lower lobe.  There is some new cluster of nodularity seen in the left upper lobe which could be infection versus aspiration.  Given that she is 89 years of age a lifelong non-smoker I think it would be okay to forego any future CT scan surveillance.  Patient and family are agreeable with this plan  CBC with differential in 3 months in 6 months and I will see her back in 6 months   Visit Diagnosis 1. Polycythemia vera ([place])   2. High risk medication use   3. Lung nodules      Dr. Randa Evens, MD, MPH [facility] at [facility] [id number] 08/17/2022 2:51 PM

## 2022-09-26 ENCOUNTER — Other Ambulatory Visit: Payer: Medicare Other

## 2022-09-26 ENCOUNTER — Ambulatory Visit: Payer: Medicare Other | Admitting: Oncology

## 2022-10-16 ENCOUNTER — Other Ambulatory Visit: Payer: Self-pay | Admitting: Oncology

## 2022-10-16 NOTE — Telephone Encounter (Signed)
CBC with Differential/Platelet Order: [id number] Status: Final result     Visible to patient: Yes (seen)     Next appt: 11/13/2022 at 11:30 AM in Oncology ([facility])     Dx: Polycythemia vera   0 Result Notes          Component Ref Range & Units 2 mo ago (08/15/22) 3 mo ago (06/26/22) 5 mo ago (05/02/22) 6 mo ago (03/28/22) 9 mo ago (12/30/21) 1 yr ago (09/24/21) 1 yr ago (06/26/21)  WBC 4.0 - 10.5 K/uL 5.5 7.8 7.0 6.0 5.3 6.0 6.1  RBC 3.87 - 5.11 MIL/uL 3.80 Low  3.17 Low  3.77 Low  3.97 3.97 4.03 3.88  Hemoglobin 12.0 - 15.0 g/dL 40.9 81.1 Low  91.4 Low  12.8 12.7 12.7 12.2  HCT 36.0 - 46.0 % 40.1 32.2 Low  37.0 38.6 39.0 39.9 38.1  MCV 80.0 - 100.0 fL 105.5 High  101.6 High  98.1 97.2 98.2 99.0 98.2  MCH 26.0 - 34.0 pg 33.2 32.8 31.6 32.2 32.0 31.5 31.4  MCHC 30.0 - 36.0 g/dL 78.2 95.6 21.3 08.6 57.8 31.8 32.0  RDW 11.5 - 15.5 % 14.6 16.0 High  14.4 14.5 14.6 14.0 15.3  Platelets 150 - 400 K/uL 365 361 542 High  500 High  521 High  461 High  475 High   nRBC 0.0 - 0.2 % 0.0 0.0 0.0 0.0 0.0 0.0 0.0  Neutrophils Relative % % 76 80 77 67 68 62 71  Neutro Abs 1.7 - 7.7 K/uL 4.2 6.3 5.4 4.1 3.6 3.7 4.3  Lymphocytes Relative % Lymphs Abs 0.7 - 4.0 K/uL 0.9 0.8 1.0 1.3 1.1 1.5 1.1  Monocytes Relative % Monocytes Absolute 0.1 - 1.0 K/uL 0.3 0.6 0.4 0.5 0.4 0.6 0.4  Eosinophils Relative % Eosinophils Absolute 0.0 - 0.5 K/uL 0.1 0.1 0.2 0.1 0.1 0.2 0.2  Basophils Relative % Basophils Absolute 0.0 - 0.1 K/uL 0.0 0.0 0.1 0.1 0.1 0.1 0.1  Immature Granulocytes % 0 0 0 0 0 0 0  Abs Immature Granulocytes 0.00 - 0.07 K/uL 0.01 0.03 CM 0.03 CM 0.01 CM 0.01 CM 0.01 CM 0.01 CM  Comment: Performed at [facility], [address] [place], [place] [address]  Resulting Agency [facility]         Specimen Collected: 08/15/22  11:16 Last Resulted: 08/15/22 11:40

## 2022-11-13 ENCOUNTER — Inpatient Hospital Stay: Payer: Medicare Other | Attending: Oncology

## 2022-11-13 DIAGNOSIS — D45 Polycythemia vera: Secondary | ICD-10-CM

## 2022-11-13 LAB — CBC WITH DIFFERENTIAL/PLATELET
Abs Immature Granulocytes: 0.06 10*3/uL (ref 0.00–0.07)
Basophils Absolute: 0.1 10*3/uL (ref 0.0–0.1)
Basophils Relative: 1 %
Eosinophils Absolute: 0.1 10*3/uL (ref 0.0–0.5)
Eosinophils Relative: 1 %
HCT: 40.1 % (ref 36.0–46.0)
Hemoglobin: 12.8 g/dL (ref 12.0–15.0)
Immature Granulocytes: 1 %
Lymphocytes Relative: 10 %
Lymphs Abs: 0.9 10*3/uL (ref 0.7–4.0)
MCH: 32.8 pg (ref 26.0–34.0)
MCHC: 31.9 g/dL (ref 30.0–36.0)
MCV: 102.8 fL — ABNORMAL HIGH (ref 80.0–100.0)
Monocytes Absolute: 0.6 10*3/uL (ref 0.1–1.0)
Monocytes Relative: 7 %
Neutro Abs: 7.6 10*3/uL (ref 1.7–7.7)
Neutrophils Relative %: 80 %
Platelets: 564 10*3/uL — ABNORMAL HIGH (ref 150–400)
RBC: 3.9 MIL/uL (ref 3.87–5.11)
RDW: 14.3 % (ref 11.5–15.5)
WBC: 9.3 10*3/uL (ref 4.0–10.5)
nRBC: 0 % (ref 0.0–0.2)

## 2022-12-24 ENCOUNTER — Other Ambulatory Visit: Payer: Self-pay

## 2022-12-24 MED ORDER — HYDROXYUREA 500 MG PO CAPS: 500.0000 mg | ORAL_CAPSULE | ORAL | 3 refills | Status: DC

## 2022-12-24 NOTE — Telephone Encounter (Signed)
Patient called asking for refill, refill sent according to Dr. Smith Robert last note ( take 500mg  6 times a week)

## 2022-12-26 ENCOUNTER — Telehealth: Payer: Self-pay

## 2022-12-26 NOTE — Telephone Encounter (Signed)
Daughter in law Clydie Braun) calling requesting a CBC or any other appropriate blood work due to bruising on patients arms ("little spots not petechia")

## 2022-12-29 ENCOUNTER — Telehealth: Payer: Self-pay | Admitting: *Deleted

## 2022-12-29 DIAGNOSIS — D45 Polycythemia vera: Secondary | ICD-10-CM

## 2022-12-29 DIAGNOSIS — Z79899 Other long term (current) drug therapy: Secondary | ICD-10-CM

## 2022-12-29 DIAGNOSIS — T148XXA Other injury of unspecified body region, initial encounter: Secondary | ICD-10-CM

## 2022-12-29 NOTE — Telephone Encounter (Signed)
Daughter wanted someone to see her for bruising. She is ok to tom 7/2 on App-sarah [place]  and have labs. Lab 1:00 , see APP 1:15. pm  daughter ok with this and I put orders in

## 2022-12-29 NOTE — Telephone Encounter (Signed)
Daughter called again today stating that no one ever called her back Friday regarding her request to have CBC checked due to increased bruising. Please advise

## 2022-12-30 ENCOUNTER — Inpatient Hospital Stay: Payer: Medicare Other

## 2022-12-30 ENCOUNTER — Inpatient Hospital Stay: Payer: Medicare Other | Attending: Oncology | Admitting: Medical Oncology

## 2022-12-30 ENCOUNTER — Encounter: Payer: Self-pay | Admitting: Medical Oncology

## 2022-12-30 VITALS — BP 157/70 | HR 68 | Temp 98.8°F | Wt 135.5 lb

## 2022-12-30 DIAGNOSIS — Z79899 Other long term (current) drug therapy: Secondary | ICD-10-CM

## 2022-12-30 DIAGNOSIS — D45 Polycythemia vera: Secondary | ICD-10-CM | POA: Diagnosis not present

## 2022-12-30 DIAGNOSIS — Z923 Personal history of irradiation: Secondary | ICD-10-CM | POA: Diagnosis not present

## 2022-12-30 DIAGNOSIS — Z853 Personal history of malignant neoplasm of breast: Secondary | ICD-10-CM | POA: Insufficient documentation

## 2022-12-30 DIAGNOSIS — R918 Other nonspecific abnormal finding of lung field: Secondary | ICD-10-CM | POA: Insufficient documentation

## 2022-12-30 DIAGNOSIS — T148XXA Other injury of unspecified body region, initial encounter: Secondary | ICD-10-CM

## 2022-12-30 DIAGNOSIS — Z86711 Personal history of pulmonary embolism: Secondary | ICD-10-CM | POA: Insufficient documentation

## 2022-12-30 LAB — CBC WITH DIFFERENTIAL/PLATELET
Abs Immature Granulocytes: 0.01 10*3/uL (ref 0.00–0.07)
Basophils Absolute: 0.1 10*3/uL (ref 0.0–0.1)
Basophils Relative: 2 %
Eosinophils Absolute: 0.3 10*3/uL (ref 0.0–0.5)
Eosinophils Relative: 6 %
HCT: 39 % (ref 36.0–46.0)
Hemoglobin: 12.5 g/dL (ref 12.0–15.0)
Immature Granulocytes: 0 %
Lymphocytes Relative: 19 %
Lymphs Abs: 0.9 10*3/uL (ref 0.7–4.0)
MCH: 32.3 pg (ref 26.0–34.0)
MCHC: 32.1 g/dL (ref 30.0–36.0)
MCV: 100.8 fL — ABNORMAL HIGH (ref 80.0–100.0)
Monocytes Absolute: 0.3 10*3/uL (ref 0.1–1.0)
Monocytes Relative: 7 %
Neutro Abs: 3.1 10*3/uL (ref 1.7–7.7)
Neutrophils Relative %: 66 %
Platelets: 456 10*3/uL — ABNORMAL HIGH (ref 150–400)
RBC: 3.87 MIL/uL (ref 3.87–5.11)
RDW: 14.5 % (ref 11.5–15.5)
WBC: 4.7 10*3/uL (ref 4.0–10.5)
nRBC: 0 % (ref 0.0–0.2)

## 2022-12-30 NOTE — Progress Notes (Signed)
Hematology/Oncology Consult note [facility]  Telephone:([phone] [phone]  Patient Care Team: Myrene Buddy, NP as PCP - General (Internal Medicine) Creig Hines, MD as Consulting Physician (Oncology)   Name of the patient: Summer Green  [id number]  [birth year]   Date of visit: 12/30/22  Diagnosis-  1.  JAK2 positive polycythemia vera 2.  Lung nodules  Treatment:  Hydrea 500 mg 6 days a week 2.   Previous surveillance with CT scan- now deferred   Chief complaint/ Reason for visit- follow-up of polycythemia vera  Heme/Onc history: Patient is a 89 year old female who was diagnosed with JAK2 positive polycythemia vera back in 2018 in [place].  Patient states that she has had multiple episodes of phlebotomy back then but has not required phlebotomy now for a long time. JAK2 revealed V617F mutation on 09/15/2016. Factor V Leiden and prothrombin gene mutation were negative on 09/12/2016. Bone marrow on 11/03/2016 showed first regular bone marrow with panmyelosis and some lymphoid aggregates. Special stains were not diagnostic of lymphoproliferative disease morphologically. Flow cytometry showed small population (1.2%) of monoclonal B cells.    She also has a history of stage I right breast cancer in 2012 and has completed lumpectomy radiation treatment as well as adjuvant hormone therapy.  History of PE in 2018 possibly secondary to anastrozole versus P vera for which she was on Pradaxa followed by Xarelto.  Xarelto was then discontinued due to diverticular bleed in 2019 and patient has not had any thrombotic episodes since then.   Patient was noted toHave a possible lung nodule based on a CTA neck done at [facility] in August 2023 which showed a 1.5 x 0.8 mm nodularity in the right lung aspects.  This was followed by a CT chest without contrast in October 2023 which showed scattered solid pulmonary nodules in both the lungs with the largest one  measuring 0.8 cm in the right middle lobe.  Interval history-patient is currently on Hydrea 500 mg 6 days a week.  She does well with this medication aside from some fatigue. Possibly improved when they reduced from 7 days per week to 6 days per week.   She has noticed a few scattered bruise like lesions of her arm but denies any other bleeding or bruising events. She continues to have her chronic SOB which is stable.   ECOG PS- 1 Pain scale- 0   Review of systems- Review of Systems  Constitutional:  Positive for malaise/fatigue. Negative for chills, fever and weight loss.  HENT:  Negative for congestion, ear discharge and nosebleeds.   Eyes:  Negative for blurred vision.  Respiratory:  Negative for cough, hemoptysis, sputum production, shortness of breath and wheezing.   Cardiovascular:  Negative for chest pain, palpitations, orthopnea and claudication.  Gastrointestinal:  Negative for abdominal pain, blood in stool, constipation, diarrhea, heartburn, melena, nausea and vomiting.  Genitourinary:  Negative for dysuria, flank pain, frequency, hematuria and urgency.  Musculoskeletal:  Negative for back pain, joint pain and myalgias.  Skin:  Negative for rash.  Neurological:  Negative for dizziness, tingling, focal weakness, seizures, weakness and headaches.  Endo/Heme/Allergies:  Does not bruise/bleed easily.  Psychiatric/Behavioral:  Negative for depression and suicidal ideas. The patient does not have insomnia.       Allergies  Allergen Reactions   Other Anaphylaxis   Shellfish Allergy Anaphylaxis   Amoxicillin    Codeine    Iodine    Morphine And Codeine  Past Medical History:  Diagnosis Date   Asthma    Breast cancer ([facility])    right side   Bronchitis    Pulmonary embolism ([facility])      Past Surgical History:  Procedure Laterality Date   BREAST LUMPECTOMY     right side   CHOLECYSTECTOMY     TONSILLECTOMY      Social History   Socioeconomic History   Marital  status: Married    Spouse name: Not on file   Number of children: Not on file   Years of education: Not on file   Highest education level: Not on file  Occupational History   Not on file  Tobacco Use   Smoking status: Former   Smokeless tobacco: Never  Vaping Use   Vaping Use: Never used  Substance and Sexual Activity   Alcohol use: No   Drug use: No   Sexual activity: Not Currently  Other Topics Concern   Not on file  Social History Narrative   Not on file   Social Determinants of Health   Financial Resource Strain: Not on file  Food Insecurity: Not on file  Transportation Needs: Not on file  Physical Activity: Not on file  Stress: Not on file  Social Connections: Not on file  Intimate Partner Violence: Not on file    Family History  Problem Relation Age of Onset   Diabetes Mother    Heart disease Father      Current Outpatient Medications:    acetaminophen (TYLENOL) 500 MG tablet, Take by mouth., Disp: , Rfl:    albuterol (PROVENTIL) (2.5 MG/3ML) 0.083% nebulizer solution, Take 3 mLs (2.5 mg total) by nebulization every 4 (four) hours as needed for wheezing or shortness of breath., Disp: 75 mL, Rfl: 0   albuterol (VENTOLIN HFA) 108 (90 Base) MCG/ACT inhaler, Ventolin HFA 90 mcg/actuation aerosol inhaler  INHALE 2 INHALATIONS INTO THE LUNGS EVERY 4 HOURS AS NEEDED FOR WHEEZING OR SHORTNESS OF BREATH, Disp: , Rfl:    aspirin 81 MG EC tablet, Take by mouth., Disp: , Rfl:    desonide (DESOWEN) 0.05 % cream, Apply topically., Disp: , Rfl:    escitalopram (LEXAPRO) 10 MG tablet, Take 10 mg by mouth daily., Disp: , Rfl:    hydroxyurea (HYDREA) 500 MG capsule, Take 1 capsule (500 mg total) by mouth See admin instructions. May take with food to minimize GI side effects., Disp: 51 capsule, Rfl: 3   loratadine (CLARITIN) 10 MG tablet, Take by mouth., Disp: , Rfl:    omeprazole (PRILOSEC) 10 MG capsule, Take 1 capsule by mouth daily., Disp: , Rfl:    clopidogrel (PLAVIX) 75 MG  tablet, , Disp: , Rfl:    cyanocobalamin 1000 MCG tablet, Take by mouth. (Patient not taking: Reported on 12/30/2022), Disp: , Rfl:   Physical exam:  Vitals:   12/30/22 1311  BP: (!) 157/70  Pulse: 68  Temp: 98.8 F (37.1 C)  TempSrc: Tympanic  SpO2: 98%  Weight: 135 lb 8 oz (61.5 kg)   Physical Exam Vitals and nursing note reviewed.  Constitutional:      General: She is not in acute distress.    Appearance: Normal appearance. She is not ill-appearing, toxic-appearing or diaphoretic.  HENT:     Head: Normocephalic.  Cardiovascular:     Rate and Rhythm: Normal rate and regular rhythm.     Heart sounds: Normal heart sounds.  Pulmonary:     Effort: Pulmonary effort is normal.  Breath sounds: Normal breath sounds.  Abdominal:     Palpations: Abdomen is soft.  Musculoskeletal:     Cervical back: Neck supple.  Lymphadenopathy:     Cervical: No cervical adenopathy.  Skin:    General: Skin is warm and dry.     Coloration: Jaundice: Few scattered ~33mm bruises of arms. No bruising of trunk.     Findings: Bruising present.  Neurological:     Mental Status: She is alert and oriented to person, place, and time.         Latest Ref Rng & Units 05/02/2022    2:03 PM  CMP  Glucose 70 - 99 mg/dL 119   BUN 8 - 23 mg/dL 19   Creatinine 1.47 - 1.00 mg/dL 8.29   Sodium 562 - 130 mmol/L 134   Potassium 3.5 - 5.1 mmol/L 5.0   Chloride 98 - 111 mmol/L 102   CO2 22 - 32 mmol/L 25   Calcium 8.9 - 10.3 mg/dL 9.9   Total Protein 6.5 - 8.1 g/dL 6.7   Total Bilirubin 0.3 - 1.2 mg/dL 0.4   Alkaline Phos 38 - 126 U/L 74   AST 15 - 41 U/L 24   ALT 0 - 44 U/L 16       Latest Ref Rng & Units 12/30/2022    1:00 PM  CBC  WBC 4.0 - 10.5 K/uL 4.7   Hemoglobin 12.0 - 15.0 g/dL 86.5   Hematocrit 78.4 - 46.0 % 39.0   Platelets 150 - 400 K/uL 456     No images are attached to the encounter.  No results found.   Assessment and plan- Patient is a 89 y.o. female who is here for follow-up of  following issues  JAK2 positive polycythemia vera: Per Dr. Smith Robert goal is for her platelets to remain roughly less than 500 given her age. She is doing well on 6 day per week Hydrea. Platelets are 456, Hgb 12.5. Continue current regimen.   RTC 3 months lab only (CBC) RTC 6 months MD, labs ( CBC w/)  Visit Diagnosis 1. Polycythemia vera ([facility])    Jannetta Quint [facility] at [facility] [phone] 12/30/2022 1:42 PM

## 2022-12-30 NOTE — Addendum Note (Signed)
Addended by: Alinda Deem H on: 12/30/2022 03:33 PM   Modules accepted: Orders

## 2023-01-02 IMAGING — CR DG FINGER LITTLE 2+V*R*
3 series · 3 of 3 positions shown · non-contrast
Comparison: None.

CLINICAL DATA: Fall.  Pinky finger injury

EXAM:
RIGHT LITTLE FINGER 2+V

[finger ap]
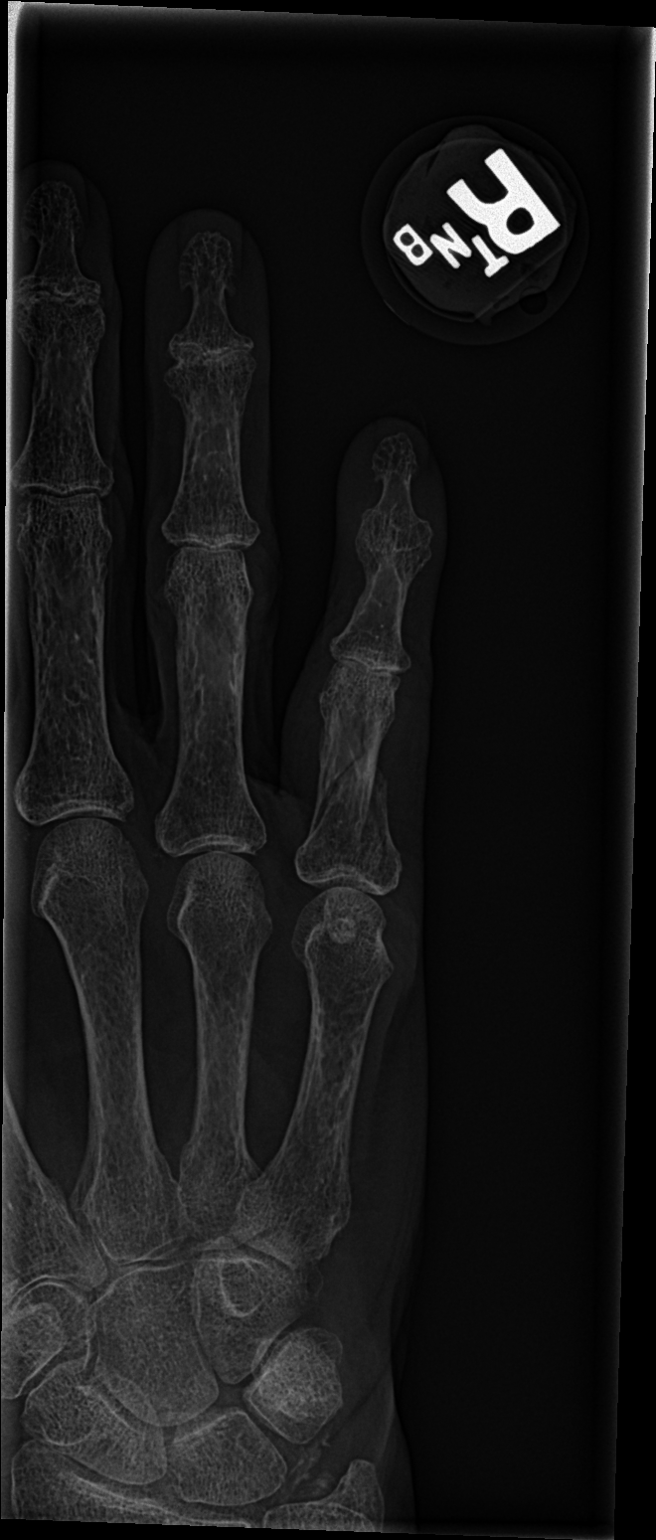

[finger obl]
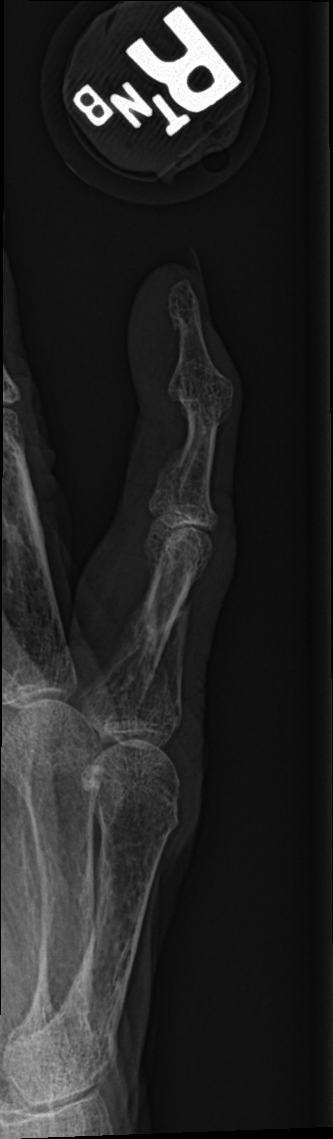

[finger lat]
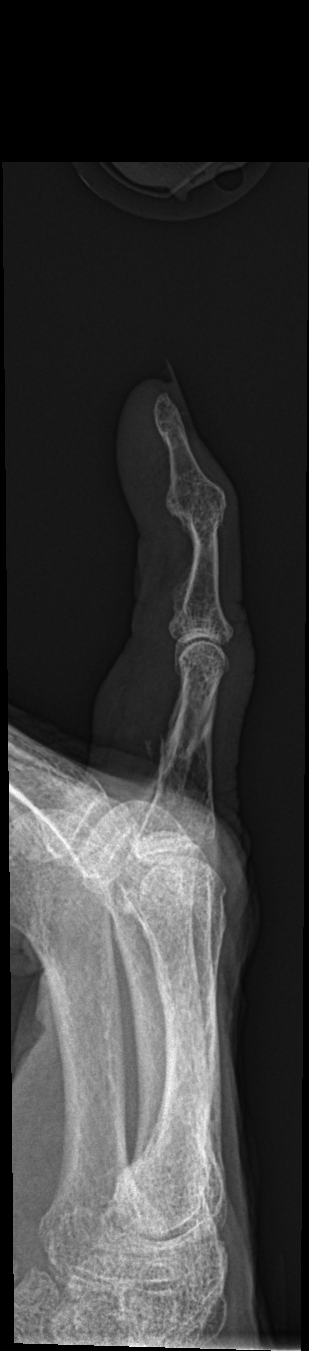

[3 of 3 positions shown; findings below may reference images not displayed]

FINDINGS: Comminuted fracture mid shaft of the proximal fifth phalanx with
mild displacement. No other fracture

Osteoarthritis is present in the D IP and PIP joints.
IMPRESSION: Mildly displaced fracture mid shaft of the proximal fifth phalanx.

## 2023-01-02 NOTE — Telephone Encounter (Signed)
Can you please follow-up with her on Monday and see how she is doing and if she needs to be seen in [facility] for her bruising?

## 2023-02-13 ENCOUNTER — Ambulatory Visit: Payer: Medicare Other | Admitting: Oncology

## 2023-02-13 ENCOUNTER — Other Ambulatory Visit: Payer: Medicare Other

## 2023-04-01 ENCOUNTER — Inpatient Hospital Stay: Payer: Medicare Other | Attending: Oncology

## 2023-04-01 DIAGNOSIS — D45 Polycythemia vera: Secondary | ICD-10-CM | POA: Diagnosis present

## 2023-04-01 LAB — CBC WITH DIFFERENTIAL/PLATELET
Abs Immature Granulocytes: 0.01 10*3/uL (ref 0.00–0.07)
Basophils Absolute: 0 10*3/uL (ref 0.0–0.1)
Basophils Relative: 1 %
Eosinophils Absolute: 0.1 10*3/uL (ref 0.0–0.5)
Eosinophils Relative: 2 %
HCT: 38.6 % (ref 36.0–46.0)
Hemoglobin: 12.3 g/dL (ref 12.0–15.0)
Immature Granulocytes: 0 %
Lymphocytes Relative: 14 %
Lymphs Abs: 0.7 10*3/uL (ref 0.7–4.0)
MCH: 33.1 pg (ref 26.0–34.0)
MCHC: 31.9 g/dL (ref 30.0–36.0)
MCV: 103.8 fL — ABNORMAL HIGH (ref 80.0–100.0)
Monocytes Absolute: 0.3 10*3/uL (ref 0.1–1.0)
Monocytes Relative: 5 %
Neutro Abs: 4 10*3/uL (ref 1.7–7.7)
Neutrophils Relative %: 78 %
Platelets: 416 10*3/uL — ABNORMAL HIGH (ref 150–400)
RBC: 3.72 MIL/uL — ABNORMAL LOW (ref 3.87–5.11)
RDW: 14.6 % (ref 11.5–15.5)
WBC: 5.1 10*3/uL (ref 4.0–10.5)
nRBC: 0 % (ref 0.0–0.2)

## 2023-07-03 ENCOUNTER — Encounter: Payer: Self-pay | Admitting: Nurse Practitioner

## 2023-07-03 ENCOUNTER — Inpatient Hospital Stay (HOSPITAL_BASED_OUTPATIENT_CLINIC_OR_DEPARTMENT_OTHER): Payer: Medicare Other | Admitting: Nurse Practitioner

## 2023-07-03 ENCOUNTER — Inpatient Hospital Stay: Payer: Medicare Other | Attending: Oncology

## 2023-07-03 ENCOUNTER — Other Ambulatory Visit: Payer: Self-pay

## 2023-07-03 VITALS — BP 132/77 | HR 65 | Temp 98.0°F | Resp 17 | Wt 136.8 lb

## 2023-07-03 DIAGNOSIS — Z853 Personal history of malignant neoplasm of breast: Secondary | ICD-10-CM | POA: Diagnosis not present

## 2023-07-03 DIAGNOSIS — D45 Polycythemia vera: Secondary | ICD-10-CM

## 2023-07-03 DIAGNOSIS — Z86711 Personal history of pulmonary embolism: Secondary | ICD-10-CM | POA: Insufficient documentation

## 2023-07-03 DIAGNOSIS — Z923 Personal history of irradiation: Secondary | ICD-10-CM | POA: Diagnosis not present

## 2023-07-03 DIAGNOSIS — R918 Other nonspecific abnormal finding of lung field: Secondary | ICD-10-CM | POA: Insufficient documentation

## 2023-07-03 DIAGNOSIS — Z79899 Other long term (current) drug therapy: Secondary | ICD-10-CM | POA: Diagnosis not present

## 2023-07-03 LAB — CBC WITH DIFFERENTIAL (CANCER CENTER ONLY)
Abs Immature Granulocytes: 0.02 10*3/uL (ref 0.00–0.07)
Basophils Absolute: 0.1 10*3/uL (ref 0.0–0.1)
Basophils Relative: 1 %
Eosinophils Absolute: 0.2 10*3/uL (ref 0.0–0.5)
Eosinophils Relative: 3 %
HCT: 37 % (ref 36.0–46.0)
Hemoglobin: 12.1 g/dL (ref 12.0–15.0)
Immature Granulocytes: 0 %
Lymphocytes Relative: 15 %
Lymphs Abs: 0.8 10*3/uL (ref 0.7–4.0)
MCH: 34 pg (ref 26.0–34.0)
MCHC: 32.7 g/dL (ref 30.0–36.0)
MCV: 103.9 fL — ABNORMAL HIGH (ref 80.0–100.0)
Monocytes Absolute: 0.4 10*3/uL (ref 0.1–1.0)
Monocytes Relative: 7 %
Neutro Abs: 4 10*3/uL (ref 1.7–7.7)
Neutrophils Relative %: 74 %
Platelet Count: 423 10*3/uL — ABNORMAL HIGH (ref 150–400)
RBC: 3.56 MIL/uL — ABNORMAL LOW (ref 3.87–5.11)
RDW: 13.6 % (ref 11.5–15.5)
WBC Count: 5.4 10*3/uL (ref 4.0–10.5)
nRBC: 0 % (ref 0.0–0.2)

## 2023-07-03 NOTE — Progress Notes (Signed)
 Hematology/Oncology Consult Note [facility]  [phone] [phone]  Patient Care Team: Don Lauraine Collar, NP as PCP - General (Internal Medicine) Melanee Annah BROCKS, MD as Consulting Physician (Oncology)   Name of the patient: Summer Green  [id number]  April 30, 1927   Date of visit: 07/03/23  Diagnosis-  1.  JAK2 positive polycythemia vera 2.  Lung nodules  Treatment:  Hydrea  500 mg 6 days a week 2.   Previous surveillance with CT scan- now deferred   Chief complaint/ Reason for visit- follow-up of polycythemia vera  Heme/Onc history: Patient is a 89 year old female who was diagnosed with JAK2 positive polycythemia vera back in 2018 in [place] .  Patient states that she has had multiple episodes of phlebotomy back then but has not required phlebotomy now for a long time. JAK2 revealed V617F mutation on 09/15/2016. Factor V Leiden and prothrombin gene mutation were negative on 09/12/2016. Bone marrow on 11/03/2016 showed first regular bone marrow with panmyelosis and some lymphoid aggregates. Special stains were not diagnostic of lymphoproliferative disease morphologically. Flow cytometry showed small population (1.2%) of monoclonal B cells.    She also has a history of stage I right breast cancer in 2012 and has completed lumpectomy radiation treatment as well as adjuvant hormone therapy.  History of PE in 2018 possibly secondary to anastrozole versus P vera for which she was on Pradaxa followed by Xarelto.  Xarelto was then discontinued due to diverticular bleed in 2019 and patient has not had any thrombotic episodes since then.   Patient was noted to have a possible lung nodule based on a CTA neck done at [facility] in August 2023 which showed a 1.5 x 0.8 mm nodularity in the right lung aspects.  This was followed by a CT chest without contrast in October 2023 which showed scattered solid pulmonary nodules in both the lungs with the largest one  measuring 0.8 cm in the right middle lobe.  Interval history- She continues hydrea  500 mg 6 days a week. Toelrating well. No interval infections or shingles. Denies worsening fatigue. Feels at baseline.   ECOG PS- 0 Pain scale- 0  Review of systems- Review of Systems  Constitutional:  Positive for malaise/fatigue. Negative for chills, fever and weight loss.  HENT:  Negative for congestion, ear discharge and nosebleeds.   Eyes:  Negative for blurred vision.  Respiratory:  Negative for cough, hemoptysis, sputum production, shortness of breath and wheezing.   Cardiovascular:  Negative for chest pain, palpitations, orthopnea and claudication.  Gastrointestinal:  Negative for abdominal pain, blood in stool, constipation, diarrhea, heartburn, melena, nausea and vomiting.  Genitourinary:  Negative for dysuria, flank pain, frequency, hematuria and urgency.  Musculoskeletal:  Negative for back pain, joint pain and myalgias.  Skin:  Negative for rash.  Neurological:  Negative for dizziness, tingling, focal weakness, seizures, weakness and headaches.  Endo/Heme/Allergies:  Does not bruise/bleed easily.  Psychiatric/Behavioral:  Negative for depression and suicidal ideas. The patient does not have insomnia.      Allergies  Allergen Reactions   Other Anaphylaxis   Shellfish Allergy Anaphylaxis   Amoxicillin    Codeine    Iodine    Morphine And Codeine     Past Medical History:  Diagnosis Date   Asthma    Breast cancer (HCC)    right side   Bronchitis    Pulmonary embolism (HCC)     Past Surgical History:  Procedure Laterality Date   BREAST LUMPECTOMY     right  side   CHOLECYSTECTOMY     TONSILLECTOMY      Social History   Socioeconomic History   Marital status: Married    Spouse name: Not on file   Number of children: Not on file   Years of education: Not on file   Highest education level: Not on file  Occupational History   Not on file  Tobacco Use   Smoking status:  Former   Smokeless tobacco: Never  Vaping Use   Vaping status: Never Used  Substance and Sexual Activity   Alcohol use: No   Drug use: No   Sexual activity: Not Currently  Other Topics Concern   Not on file  Social History Narrative   Not on file   Social Drivers of Health   Financial Resource Strain: Not on file  Food Insecurity: Not on file  Transportation Needs: Not on file  Physical Activity: Not on file  Stress: Not on file  Social Connections: Not on file  Intimate Partner Violence: Not on file    Family History  Problem Relation Age of Onset   Diabetes Mother    Heart disease Father     Current Outpatient Medications:    acetaminophen (TYLENOL) 500 MG tablet, Take by mouth., Disp: , Rfl:    albuterol  (VENTOLIN  HFA) 108 (90 Base) MCG/ACT inhaler, Ventolin  HFA 90 mcg/actuation aerosol inhaler  INHALE 2 INHALATIONS INTO THE LUNGS EVERY 4 HOURS AS NEEDED FOR WHEEZING OR SHORTNESS OF BREATH, Disp: , Rfl:    aspirin 81 MG EC tablet, Take by mouth., Disp: , Rfl:    clonazePAM (KLONOPIN) 0.25 MG disintegrating tablet, Take by mouth., Disp: , Rfl:    desonide (DESOWEN) 0.05 % cream, Apply topically., Disp: , Rfl:    escitalopram (LEXAPRO) 10 MG tablet, Take 10 mg by mouth daily., Disp: , Rfl:    hydroxyurea  (HYDREA ) 500 MG capsule, Take 1 capsule (500 mg total) by mouth See admin instructions. May take with food to minimize GI side effects., Disp: 51 capsule, Rfl: 3   loratadine (CLARITIN) 10 MG tablet, Take by mouth., Disp: , Rfl:    omeprazole (PRILOSEC) 10 MG capsule, Take 1 capsule by mouth daily., Disp: , Rfl:    pravastatin (PRAVACHOL) 20 MG tablet, Take 1 tablet by mouth at bedtime., Disp: , Rfl:    albuterol  (PROVENTIL ) (2.5 MG/3ML) 0.083% nebulizer solution, Take 3 mLs (2.5 mg total) by nebulization every 4 (four) hours as needed for wheezing or shortness of breath. (Patient not taking: Reported on 07/03/2023), Disp: 75 mL, Rfl: 0  Physical exam:  Vitals:   07/03/23  1317  BP: 132/77  Pulse: 65  Resp: 17  Temp: 98 F (36.7 C)  SpO2: 96%  Weight: 136 lb 12.8 oz (62.1 kg)   Physical Exam Constitutional:      General: She is not in acute distress.    Appearance: She is well-developed. She is not ill-appearing.  HENT:     Head: Normocephalic.     Mouth/Throat:     Pharynx: No oropharyngeal exudate.  Eyes:     General: No scleral icterus. Pulmonary:     Effort: Pulmonary effort is normal. No respiratory distress.     Breath sounds: Normal breath sounds.  Abdominal:     General: There is no distension.  Musculoskeletal:        General: No tenderness or deformity.     Comments: cane  Skin:    General: Skin is warm and dry.  Coloration: Skin is not pale.  Neurological:     Mental Status: She is alert and oriented to person, place, and time.     Motor: No weakness.  Psychiatric:        Mood and Affect: Mood normal.        Behavior: Behavior normal.        Latest Ref Rng & Units 07/03/2023    1:06 PM  CBC  WBC 4.0 - 10.5 K/uL 5.4   Hemoglobin 12.0 - 15.0 g/dL 87.8   Hematocrit 63.9 - 46.0 % 37.0   Platelets 150 - 400 K/uL 423     Assessment and plan- Patient is a 89 y.o. female who returns to clinic for follow up of  Polycythemia Vera- JAK2 positive. On hydrea  6 days per week. Goal platelets < 500 based on age. Plts 423 today. Tolerating well. No interval infections or drops in blood counts otherwise. Continue hydrea  500 mg 6 days a week.   Disposition:  3 mo- lab only (cbc) 6 mo- lab (cbc, cmp), Dr Melanee- la    Visit Diagnosis 1. Encounter for long-term (current) use of high-risk medication   2. Polycythemia vera (HCC)     Tinnie Dawn, DNP, AGNP-C, [facility] [phone] (clinic) 07/03/2023

## 2023-10-01 ENCOUNTER — Inpatient Hospital Stay: Payer: Medicare Other

## 2023-10-06 ENCOUNTER — Other Ambulatory Visit: Payer: Self-pay

## 2023-10-06 DIAGNOSIS — D45 Polycythemia vera: Secondary | ICD-10-CM

## 2023-10-07 ENCOUNTER — Inpatient Hospital Stay: Attending: Oncology

## 2023-10-07 DIAGNOSIS — D45 Polycythemia vera: Secondary | ICD-10-CM | POA: Insufficient documentation

## 2023-10-07 LAB — CBC WITH DIFFERENTIAL (CANCER CENTER ONLY)
Abs Immature Granulocytes: 0.02 10*3/uL (ref 0.00–0.07)
Basophils Absolute: 0.1 10*3/uL (ref 0.0–0.1)
Basophils Relative: 1 %
Eosinophils Absolute: 0.2 10*3/uL (ref 0.0–0.5)
Eosinophils Relative: 3 %
HCT: 40.2 % (ref 36.0–46.0)
Hemoglobin: 12.9 g/dL (ref 12.0–15.0)
Immature Granulocytes: 0 %
Lymphocytes Relative: 14 %
Lymphs Abs: 1 10*3/uL (ref 0.7–4.0)
MCH: 32.8 pg (ref 26.0–34.0)
MCHC: 32.1 g/dL (ref 30.0–36.0)
MCV: 102.3 fL — ABNORMAL HIGH (ref 80.0–100.0)
Monocytes Absolute: 0.5 10*3/uL (ref 0.1–1.0)
Monocytes Relative: 6 %
Neutro Abs: 5.8 10*3/uL (ref 1.7–7.7)
Neutrophils Relative %: 76 %
Platelet Count: 518 10*3/uL — ABNORMAL HIGH (ref 150–400)
RBC: 3.93 MIL/uL (ref 3.87–5.11)
RDW: 13.9 % (ref 11.5–15.5)
WBC Count: 7.6 10*3/uL (ref 4.0–10.5)
nRBC: 0 % (ref 0.0–0.2)

## 2023-10-07 LAB — CMP (CANCER CENTER ONLY)
ALT: 16 U/L (ref 0–44)
AST: 22 U/L (ref 15–41)
Albumin: 3.8 g/dL (ref 3.5–5.0)
Alkaline Phosphatase: 83 U/L (ref 38–126)
Anion gap: 8 (ref 5–15)
BUN: 20 mg/dL (ref 8–23)
CO2: 25 mmol/L (ref 22–32)
Calcium: 9.8 mg/dL (ref 8.9–10.3)
Chloride: 102 mmol/L (ref 98–111)
Creatinine: 1.08 mg/dL — ABNORMAL HIGH (ref 0.44–1.00)
GFR, Estimated: 48 mL/min — ABNORMAL LOW (ref >60)
Glucose, Bld: 95 mg/dL (ref 70–99)
Potassium: 4.6 mmol/L (ref 3.5–5.1)
Sodium: 135 mmol/L (ref 135–145)
Total Bilirubin: 0.6 mg/dL (ref 0.0–1.2)
Total Protein: 6.4 g/dL — ABNORMAL LOW (ref 6.5–8.1)

## 2023-11-11 ENCOUNTER — Other Ambulatory Visit: Payer: Self-pay | Admitting: Orthopedic Surgery

## 2023-11-11 DIAGNOSIS — M4802 Spinal stenosis, cervical region: Secondary | ICD-10-CM

## 2023-11-11 DIAGNOSIS — M5030 Other cervical disc degeneration, unspecified cervical region: Secondary | ICD-10-CM

## 2023-11-11 DIAGNOSIS — M5412 Radiculopathy, cervical region: Secondary | ICD-10-CM

## 2023-11-13 ENCOUNTER — Ambulatory Visit: Admission: RE | Admit: 2023-11-13 | Source: Ambulatory Visit

## 2023-11-18 ENCOUNTER — Ambulatory Visit
Admission: RE | Admit: 2023-11-18 | Discharge: 2023-11-18 | Disposition: A | Discharge status: Home / Self Care | Source: Ambulatory Visit | Attending: Orthopedic Surgery | Admitting: Orthopedic Surgery

## 2023-11-18 DIAGNOSIS — M5030 Other cervical disc degeneration, unspecified cervical region: Secondary | ICD-10-CM | POA: Insufficient documentation

## 2023-11-18 DIAGNOSIS — M4802 Spinal stenosis, cervical region: Secondary | ICD-10-CM | POA: Insufficient documentation

## 2023-11-18 DIAGNOSIS — M5412 Radiculopathy, cervical region: Secondary | ICD-10-CM | POA: Insufficient documentation

## 2023-12-29 ENCOUNTER — Other Ambulatory Visit: Payer: Self-pay

## 2023-12-29 DIAGNOSIS — D45 Polycythemia vera: Secondary | ICD-10-CM

## 2023-12-30 ENCOUNTER — Inpatient Hospital Stay: Payer: Medicare Other | Attending: Oncology

## 2023-12-30 ENCOUNTER — Encounter: Payer: Self-pay | Admitting: Oncology

## 2023-12-30 ENCOUNTER — Inpatient Hospital Stay: Payer: Medicare Other | Admitting: Oncology

## 2023-12-30 VITALS — BP 152/62 | HR 65 | Temp 96.6°F | Resp 18 | Ht 66.0 in | Wt 137.0 lb

## 2023-12-30 DIAGNOSIS — D75839 Thrombocytosis, unspecified: Secondary | ICD-10-CM | POA: Insufficient documentation

## 2023-12-30 DIAGNOSIS — D45 Polycythemia vera: Secondary | ICD-10-CM | POA: Diagnosis present

## 2023-12-30 DIAGNOSIS — Z79899 Other long term (current) drug therapy: Secondary | ICD-10-CM

## 2023-12-30 LAB — CMP (CANCER CENTER ONLY)
ALT: 15 U/L (ref 0–44)
AST: 22 U/L (ref 15–41)
Albumin: 3.9 g/dL (ref 3.5–5.0)
Alkaline Phosphatase: 72 U/L (ref 38–126)
Anion gap: 6 (ref 5–15)
BUN: 22 mg/dL (ref 8–23)
CO2: 25 mmol/L (ref 22–32)
Calcium: 9.9 mg/dL (ref 8.9–10.3)
Chloride: 102 mmol/L (ref 98–111)
Creatinine: 1.06 mg/dL — ABNORMAL HIGH (ref 0.44–1.00)
GFR, Estimated: 49 mL/min — ABNORMAL LOW (ref >60)
Glucose, Bld: 76 mg/dL (ref 70–99)
Potassium: 4.3 mmol/L (ref 3.5–5.1)
Sodium: 133 mmol/L — ABNORMAL LOW (ref 135–145)
Total Bilirubin: 0.6 mg/dL (ref 0.0–1.2)
Total Protein: 6.3 g/dL — ABNORMAL LOW (ref 6.5–8.1)

## 2023-12-30 LAB — CBC (CANCER CENTER ONLY)
HCT: 35.7 % — ABNORMAL LOW (ref 36.0–46.0)
Hemoglobin: 11.6 g/dL — ABNORMAL LOW (ref 12.0–15.0)
MCH: 33.3 pg (ref 26.0–34.0)
MCHC: 32.5 g/dL (ref 30.0–36.0)
MCV: 102.6 fL — ABNORMAL HIGH (ref 80.0–100.0)
Platelet Count: 478 10*3/uL — ABNORMAL HIGH (ref 150–400)
RBC: 3.48 MIL/uL — ABNORMAL LOW (ref 3.87–5.11)
RDW: 15.6 % — ABNORMAL HIGH (ref 11.5–15.5)
WBC Count: 6.7 10*3/uL (ref 4.0–10.5)
nRBC: 0 % (ref 0.0–0.2)

## 2023-12-30 NOTE — Progress Notes (Signed)
 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  Patient states that she has had multiple episodes of phlebotomy back then but has not required phlebotomy now for a long time. JAK2 revealed V617F mutation on 09/15/2016. Factor V Leiden and prothrombin gene mutation were negative on 09/12/2016. Bone marrow on 11/03/2016 showed first regular bone marrow with panmyelosis and some lymphoid aggregates. Special stains were not diagnostic of lymphoproliferative disease morphologically. Flow cytometry showed small population (1.2%) of monoclonal B cells.    She also has a history of stage I right breast cancer in 2012 and has completed lumpectomy radiation treatment as well as adjuvant hormone therapy.  History of PE in 2018 possibly secondary to anastrozole versus P vera for which she was on Pradaxa followed by Xarelto.  Xarelto was then discontinued due to diverticular bleed in 2019 and patient has not had any thrombotic episodes since then.   Patient was noted toHave a possible lung nodule based on a CTA neck done at [facility] in August 2023 which showed a 1.5 x 0.8 mm nodularity in the right lung aspects.  This was followed by a CT chest without contrast in October 2023 which showed scattered solid pulmonary nodules in both the lungs with the largest one measuring 0.8 cm in the right middle lobe.  We had discussed continued  monitoring versus foregoing further scans and patient agreeable to holding off on any CT scans in the future  Interval history-patient is currently on Hydrea  500 mg 6 times a week and is tolerating it well without any significant side effects.  She has not missed any doses.  She remains independent of her ADLs and IADLs.  ECOG PS- 2 Pain scale- 0   Review of systems- Review of Systems  Constitutional:  Negative for chills, fever, malaise/fatigue and weight loss.  HENT:  Negative for congestion, ear discharge and nosebleeds.   Eyes:  Negative for blurred vision.  Respiratory:  Negative for cough, hemoptysis, sputum production, shortness of breath and wheezing.   Cardiovascular:  Negative for chest pain, palpitations, orthopnea and claudication.  Gastrointestinal:  Negative for abdominal pain, blood in stool, constipation, diarrhea, heartburn, melena, nausea and vomiting.  Genitourinary:  Negative for dysuria, flank pain, frequency, hematuria and urgency.  Musculoskeletal:  Negative for back pain, joint pain and myalgias.  Skin:  Negative for rash.  Neurological:  Negative for dizziness, tingling, focal weakness, seizures, weakness and headaches.  Endo/Heme/Allergies:  Does not bruise/bleed easily.  Psychiatric/Behavioral:  Negative for depression and suicidal ideas. The patient does not have insomnia.       Allergies  Allergen Reactions   Other Anaphylaxis   Shellfish Allergy Anaphylaxis   Amoxicillin    Codeine    Iodine    Morphine And Codeine      Past Medical History:  Diagnosis Date   Asthma    Breast cancer (HCC)    right side   Bronchitis    Pulmonary embolism (HCC)  Past Surgical History:  Procedure Laterality Date   BREAST LUMPECTOMY     right side   CHOLECYSTECTOMY     TONSILLECTOMY      Social History   Socioeconomic History   Marital status: Married    Spouse name: Not on file   Number of children: Not on file   Years of education: Not on file    Highest education level: Not on file  Occupational History   Not on file  Tobacco Use   Smoking status: Former   Smokeless tobacco: Never  Vaping Use   Vaping status: Never Used  Substance and Sexual Activity   Alcohol use: No   Drug use: No   Sexual activity: Not Currently  Other Topics Concern   Not on file  Social History Narrative   Not on file   Social Drivers of Health   Financial Resource Strain: Low Risk  (12/29/2023)   Received from [facility]   Overall Financial Resource Strain (CARDIA)    Difficulty of Paying Living Expenses: Not hard at all  Food Insecurity: No Food Insecurity (12/29/2023)   Received from [facility]   Hunger Vital Sign    Within the past 12 months, you worried that your food would run out before you got the money to buy more.: Never true    Within the past 12 months, the food you bought just didn't last and you didn't have money to get more.: Never true  Transportation Needs: No Transportation Needs (12/29/2023)   Received from [facility] - Transportation    In the past 12 months, has lack of transportation kept you from medical appointments or from getting medications?: No    Lack of Transportation (Non-Medical): No  Physical Activity: Not on file  Stress: Not on file  Social Connections: Not on file  Intimate Partner Violence: Not on file    Family History  Problem Relation Age of Onset   Diabetes Mother    Heart disease Father      Current Outpatient Medications:    acetaminophen (TYLENOL) 500 MG tablet, Take by mouth., Disp: , Rfl:    albuterol  (PROVENTIL ) (2.5 MG/3ML) 0.083% nebulizer solution, Take 3 mLs (2.5 mg total) by nebulization every 4 (four) hours as needed for wheezing or shortness of breath., Disp: 75 mL, Rfl: 0   albuterol  (VENTOLIN  HFA) 108 (90 Base) MCG/ACT inhaler, Ventolin  HFA 90 mcg/actuation aerosol inhaler  INHALE 2 INHALATIONS INTO THE LUNGS EVERY 4  HOURS AS NEEDED FOR WHEEZING OR SHORTNESS OF BREATH, Disp: , Rfl:    aspirin 81 MG EC tablet, Take by mouth., Disp: , Rfl:    desonide (DESOWEN) 0.05 % cream, Apply topically., Disp: , Rfl:    escitalopram (LEXAPRO) 10 MG tablet, Take 10 mg by mouth daily., Disp: , Rfl:    hydroxyurea  (HYDREA ) 500 MG capsule, Take 1 capsule (500 mg total) by mouth See admin instructions. May take with food to minimize GI side effects., Disp: 51 capsule, Rfl: 3   loratadine (CLARITIN) 10 MG tablet, Take by mouth., Disp: , Rfl:    omeprazole (PRILOSEC) 10 MG capsule, Take 1 capsule by mouth daily., Disp: , Rfl:    pravastatin (PRAVACHOL) 20 MG tablet, Take 1 tablet by mouth at bedtime., Disp: , Rfl:    tiZANidine (ZANAFLEX) 2 MG tablet, Take 2 mg by mouth at bedtime., Disp: , Rfl:    traMADol (ULTRAM) 50 MG tablet, Take 50 mg  by mouth., Disp: , Rfl:    clonazePAM (KLONOPIN) 0.25 MG disintegrating tablet, Take by mouth. (Patient not taking: Reported on 12/30/2023), Disp: , Rfl:    predniSONE  (DELTASONE ) 10 MG tablet, Taper:  6 tablets today, 5 tablets tomorrow, 4 tablets next day, 3 tablets next day, 2 tablets next day, 1 tablet on last day (Patient not taking: Reported on 12/30/2023), Disp: , Rfl:   Physical exam:  Vitals:   12/30/23 1312  BP: (!) 152/62  Pulse: 65  Resp: 18  Temp: (!) 96.6 F (35.9 C)  TempSrc: Tympanic  SpO2: 97%  Weight: 137 lb (62.1 kg)  Height: 5' 6 (1.676 m)   Physical Exam Cardiovascular:     Rate and Rhythm: Normal rate and regular rhythm.     Heart sounds: Normal heart sounds.  Pulmonary:     Effort: Pulmonary effort is normal.     Breath sounds: Normal breath sounds.  Abdominal:     General: Bowel sounds are normal.     Palpations: Abdomen is soft.  Skin:    General: Skin is warm and dry.  Neurological:     Mental Status: She is alert and oriented to person, place, and time.      I have personally reviewed labs listed below:    Latest Ref Rng & Units 12/30/2023    12:47 PM  CMP  Glucose 70 - 99 mg/dL 76   BUN 8 - 23 mg/dL 22   Creatinine 9.55 - 1.00 mg/dL 8.93   Sodium 864 - 854 mmol/L 133   Potassium 3.5 - 5.1 mmol/L 4.3   Chloride 98 - 111 mmol/L 102   CO2 22 - 32 mmol/L 25   Calcium 8.9 - 10.3 mg/dL 9.9   Total Protein 6.5 - 8.1 g/dL 6.3   Total Bilirubin 0.0 - 1.2 mg/dL 0.6   Alkaline Phos 38 - 126 U/L 72   AST 15 - 41 U/L 22   ALT 0 - 44 U/L 15       Latest Ref Rng & Units 12/30/2023   12:47 PM  CBC  WBC 4.0 - 10.5 K/uL 6.7   Hemoglobin 12.0 - 15.0 g/dL 88.3   Hematocrit 63.9 - 46.0 % 35.7   Platelets 150 - 400 K/uL 478      Assessment and plan- Patient is a 89 y.o. female with history of JAK2 positive myeloproliferative neoplasm P vera versus ET presently on Hydrea  here for routine follow-up  Patient is presently on 500 mg Hydrea  6 times a week which she is tolerating well without any significant side effects.  She has received phlebotomy for her P vera many years ago.  Presently her hematocrit is less than 45.  Platelets are slightly greater than 400 but given her age I am not planning to increase her Hydrea  any further as that can potentially worsen her anemia.  I will see her back in 6 months with CBC with differential CMP ferritin and iron studies B12 folate and TSH.  Macrocytosis likely secondary to Hydrea    Visit Diagnosis 1. Polycythemia vera (HCC)   2. Thrombocytosis   3. High risk medication use      Dr. Annah Skene, MD, MPH [facility] [phone] 12/30/2023 1:51 PM

## 2024-01-31 ENCOUNTER — Other Ambulatory Visit: Payer: Self-pay | Admitting: Oncology

## 2024-02-21 ENCOUNTER — Other Ambulatory Visit: Payer: Self-pay

## 2024-02-21 ENCOUNTER — Emergency Department: Admission: EM | Admit: 2024-02-21 | Discharge: 2024-02-21 | Disposition: A | Discharge status: Home / Self Care

## 2024-02-21 ENCOUNTER — Emergency Department

## 2024-02-21 DIAGNOSIS — W01190A Fall on same level from slipping, tripping and stumbling with subsequent striking against furniture, initial encounter: Secondary | ICD-10-CM | POA: Diagnosis not present

## 2024-02-21 DIAGNOSIS — I6782 Cerebral ischemia: Secondary | ICD-10-CM | POA: Insufficient documentation

## 2024-02-21 DIAGNOSIS — S0003XA Contusion of scalp, initial encounter: Secondary | ICD-10-CM | POA: Insufficient documentation

## 2024-02-21 DIAGNOSIS — R519 Headache, unspecified: Secondary | ICD-10-CM | POA: Diagnosis present

## 2024-02-21 DIAGNOSIS — S8001XA Contusion of right knee, initial encounter: Secondary | ICD-10-CM | POA: Diagnosis not present

## 2024-02-21 DIAGNOSIS — J45909 Unspecified asthma, uncomplicated: Secondary | ICD-10-CM | POA: Diagnosis not present

## 2024-02-21 DIAGNOSIS — W19XXXA Unspecified fall, initial encounter: Secondary | ICD-10-CM

## 2024-02-21 LAB — BASIC METABOLIC PANEL WITH GFR
Anion gap: 9 (ref 5–15)
BUN: 22 mg/dL (ref 8–23)
CO2: 25 mmol/L (ref 22–32)
Calcium: 10.2 mg/dL (ref 8.9–10.3)
Chloride: 102 mmol/L (ref 98–111)
Creatinine, Ser: 1.06 mg/dL — ABNORMAL HIGH (ref 0.44–1.00)
GFR, Estimated: 49 mL/min — ABNORMAL LOW (ref >60)
Glucose, Bld: 142 mg/dL — ABNORMAL HIGH (ref 70–99)
Potassium: 4.2 mmol/L (ref 3.5–5.1)
Sodium: 136 mmol/L (ref 135–145)

## 2024-02-21 LAB — CBC WITH DIFFERENTIAL/PLATELET
Abs Immature Granulocytes: 0.02 K/uL (ref 0.00–0.07)
Basophils Absolute: 0.1 K/uL (ref 0.0–0.1)
Basophils Relative: 1 %
Eosinophils Absolute: 0.2 K/uL (ref 0.0–0.5)
Eosinophils Relative: 3 %
HCT: 40.5 % (ref 36.0–46.0)
Hemoglobin: 13.1 g/dL (ref 12.0–15.0)
Immature Granulocytes: 0 %
Lymphocytes Relative: 16 %
Lymphs Abs: 0.9 K/uL (ref 0.7–4.0)
MCH: 33.9 pg (ref 26.0–34.0)
MCHC: 32.3 g/dL (ref 30.0–36.0)
MCV: 104.7 fL — ABNORMAL HIGH (ref 80.0–100.0)
Monocytes Absolute: 0.3 K/uL (ref 0.1–1.0)
Monocytes Relative: 5 %
Neutro Abs: 4.6 K/uL (ref 1.7–7.7)
Neutrophils Relative %: 75 %
Platelets: 555 K/uL — ABNORMAL HIGH (ref 150–400)
RBC: 3.87 MIL/uL (ref 3.87–5.11)
RDW: 14.2 % (ref 11.5–15.5)
WBC: 6.1 K/uL (ref 4.0–10.5)
nRBC: 0 % (ref 0.0–0.2)

## 2024-02-21 LAB — BRAIN NATRIURETIC PEPTIDE: B Natriuretic Peptide: 99.7 pg/mL (ref 0.0–100.0)

## 2024-02-21 NOTE — ED Notes (Signed)
 Pt is on aspirin per son.

## 2024-02-21 NOTE — ED Provider Notes (Signed)
 [facility] Provider Note    Event Date/Time   First MD Initiated Contact with Patient 02/21/24 1503     (approximate)  History   Chief Complaint: Fall  HPI  Summer Green is a 89 y.o. female with a past medical history of asthma, presents to the emergency department for a fall.  According to the patient last night she states she was getting in the bed when her knee gave out causing her to fall hitting her head potentially on the bed frame per patient.  No LOC.  Patient states she was able to go to bed she has been completely normal today besides slight pain at the area where she hit her head.  However she let her family know that this had happened and they brought her to the emergency department for evaluation.  Patient has no symptoms currently besides mild pain in her right head.  No vomiting no LOC.  Vital signs are reassuring.  Physical Exam   Triage Vital Signs: ED Triage Vitals  Encounter Vitals Group     BP 02/21/24 1219 106/83     Girls Systolic BP Percentile --      Girls Diastolic BP Percentile --      Boys Systolic BP Percentile --      Boys Diastolic BP Percentile --      Pulse Rate 02/21/24 1219 83     Resp 02/21/24 1219 16     Temp 02/21/24 1219 98.4 F (36.9 C)     Temp src --      SpO2 02/21/24 1219 97 %     Weight 02/21/24 1221 133 lb (60.3 kg)     Height 02/21/24 1221 5' 6 (1.676 m)     Head Circumference --      Peak Flow --      Pain Score 02/21/24 1220 0     Pain Loc --      Pain Education --      Exclude from Growth Chart --     Most recent vital signs: Vitals:   02/21/24 1445 02/21/24 1450  BP:  (!) 147/78  Pulse: 62 61  Resp:  20  Temp:    SpO2: 100% 100%    General: Awake, no distress.  CV:  Good peripheral perfusion.  Regular rate and rhythm  Resp:  Normal effort.  Equal breath sounds bilaterally.  Abd:  No distention. Other:  Small bruise to the right knee but good range of motion.  Mall hematoma to the  right parietal scalp.  No C-spine tenderness.  RADIOLOGY  I have reviewed and interpreted the CT scan of the head.  I do not see any large bleed on my evaluation. Radiology is read the CT scan is negative for acute abnormality. CT cervical spine is negative for acute abnormality.   MEDICATIONS ORDERED IN ED: Medications - No data to display   IMPRESSION / MDM / ASSESSMENT AND PLAN / ED COURSE  I reviewed the triage vital signs and the nursing notes.  Patient's presentation is most consistent with acute presentation with potential threat to life or bodily function.  Patient presents to the emergency department after mechanical fall last night.  Overall patient appears well, no distress she is awake alert oriented answering questions appropriately states very minimal discomfort at the area where she hit her head where she still has a small hematoma.  Patient's workup in the emergency department is reassuring.  Patient's lab work shows a normal  CBC, reassuring chemistry.  BNP is normal.  CT scan of the head is normal, CT C-spine is normal.  Given the patient's reassuring workup I believe the patient safe for discharge home with outpatient follow-up.  Patient agreeable to plan of care.  FINAL CLINICAL IMPRESSION(S) / ED DIAGNOSES   Fall Head injury  Note:  This document was prepared using Dragon voice recognition software and may include unintentional dictation errors.   Dorothyann Drivers, MD 02/21/24 1511

## 2024-02-21 NOTE — ED Triage Notes (Signed)
 Pt arrives via POV from home with c/o a fall and hit their head on the bed frame and believes that they LOC for a minute. Pt is A&Ox4 during triage.

## 2024-07-06 ENCOUNTER — Inpatient Hospital Stay: Admitting: Oncology

## 2024-07-06 ENCOUNTER — Inpatient Hospital Stay: Attending: Oncology

## 2024-07-06 ENCOUNTER — Encounter: Payer: Self-pay | Admitting: Oncology

## 2024-07-06 VITALS — BP 106/69 | HR 79 | Temp 96.6°F | Resp 18 | Ht 66.0 in | Wt 137.9 lb

## 2024-07-06 DIAGNOSIS — D45 Polycythemia vera: Secondary | ICD-10-CM

## 2024-07-06 DIAGNOSIS — Z79899 Other long term (current) drug therapy: Secondary | ICD-10-CM

## 2024-07-06 LAB — CBC WITH DIFFERENTIAL (CANCER CENTER ONLY)
Abs Immature Granulocytes: 0.02 K/uL (ref 0.00–0.07)
Basophils Absolute: 0.1 K/uL (ref 0.0–0.1)
Basophils Relative: 1 %
Eosinophils Absolute: 0.2 K/uL (ref 0.0–0.5)
Eosinophils Relative: 3 %
HCT: 39.1 % (ref 36.0–46.0)
Hemoglobin: 12.7 g/dL (ref 12.0–15.0)
Immature Granulocytes: 0 %
Lymphocytes Relative: 16 %
Lymphs Abs: 0.9 K/uL (ref 0.7–4.0)
MCH: 33.2 pg (ref 26.0–34.0)
MCHC: 32.5 g/dL (ref 30.0–36.0)
MCV: 102.1 fL — ABNORMAL HIGH (ref 80.0–100.0)
Monocytes Absolute: 0.3 K/uL (ref 0.1–1.0)
Monocytes Relative: 6 %
Neutro Abs: 4.4 K/uL (ref 1.7–7.7)
Neutrophils Relative %: 74 %
Platelet Count: 440 K/uL — ABNORMAL HIGH (ref 150–400)
RBC: 3.83 MIL/uL — ABNORMAL LOW (ref 3.87–5.11)
RDW: 15.2 % (ref 11.5–15.5)
WBC Count: 5.9 K/uL (ref 4.0–10.5)
nRBC: 0 % (ref 0.0–0.2)

## 2024-07-06 LAB — CMP (CANCER CENTER ONLY)
ALT: 11 U/L (ref 0–44)
AST: 22 U/L (ref 15–41)
Albumin: 4.1 g/dL (ref 3.5–5.0)
Alkaline Phosphatase: 92 U/L (ref 38–126)
Anion gap: 9 (ref 5–15)
BUN: 25 mg/dL — ABNORMAL HIGH (ref 8–23)
CO2: 26 mmol/L (ref 22–32)
Calcium: 10.3 mg/dL (ref 8.9–10.3)
Chloride: 103 mmol/L (ref 98–111)
Creatinine: 1.17 mg/dL — ABNORMAL HIGH (ref 0.44–1.00)
GFR, Estimated: 43 mL/min — ABNORMAL LOW
Glucose, Bld: 131 mg/dL — ABNORMAL HIGH (ref 70–99)
Potassium: 4.6 mmol/L (ref 3.5–5.1)
Sodium: 138 mmol/L (ref 135–145)
Total Bilirubin: 0.2 mg/dL (ref 0.0–1.2)
Total Protein: 6.4 g/dL — ABNORMAL LOW (ref 6.5–8.1)

## 2024-07-06 LAB — IRON AND TIBC
Iron: 65 ug/dL (ref 28–170)
Saturation Ratios: 22 % (ref 10.4–31.8)
TIBC: 293 ug/dL (ref 250–450)
UIBC: 228 ug/dL

## 2024-07-06 LAB — TSH: TSH: 3.36 u[IU]/mL (ref 0.350–4.500)

## 2024-07-06 LAB — FOLATE: Folate: >20 ng/mL

## 2024-07-06 LAB — VITAMIN B12: Vitamin B-12: 889 pg/mL (ref 180–914)

## 2024-07-06 LAB — FERRITIN: Ferritin: 78 ng/mL (ref 11–307)

## 2024-07-06 NOTE — Progress Notes (Signed)
 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  Patient states that she has had multiple episodes of phlebotomy back then but has not required phlebotomy now for a long time. JAK2 revealed V617F mutation on 09/15/2016. Factor V Leiden and prothrombin gene mutation were negative on 09/12/2016. Bone marrow on 11/03/2016 showed first regular bone marrow with panmyelosis and some lymphoid aggregates. Special stains were not diagnostic of lymphoproliferative disease morphologically. Flow cytometry showed small population (1.2%) of monoclonal B cells.    She also has a history of stage I right breast cancer in 2012 and has completed lumpectomy radiation treatment as well as adjuvant hormone therapy.  History of PE in 2018 possibly secondary to anastrozole versus P vera for which she was on Pradaxa followed by Xarelto.  Xarelto was then discontinued due to diverticular bleed in 2019 and patient has not had any thrombotic episodes since then.   Patient was noted toHave a possible lung nodule based on a CTA neck done at [facility] in August 2023 which showed a 1.5 x 0.8 mm nodularity in the right lung aspects.  This was followed by a CT chest without contrast in October 2023 which showed scattered solid pulmonary nodules in both the lungs with the largest one measuring 0.8 cm in the right middle lobe.  We had discussed continued  monitoring versus foregoing further scans and patient agreeable to holding off on any CT scans in the future  Interval history- Summer Green is a 89 year old female with essential thrombocythemia managed with hydroxyurea  who presents for routine hematology follow-up.  She continues hydroxyurea  500 mg daily except Sundays, a regimen maintained for several years. Platelet counts have ranged from 400 to 555 x10^9/L over the past year, with the most recent value at 440 x10^9/L. Hydroxyurea  was initiated when her platelet count was in the 480s.  She denies thrombotic or bleeding complications, including cerebrovascular events or venous thromboembolism. She reports no new symptoms and feels well overall. Laboratory studies today show hemoglobin 12.7 g/dL and hematocrit 60%.       ECOG PS- 1 Pain scale- 0   Review of systems- Review of Systems  Constitutional:  Negative for chills, fever, malaise/fatigue and weight loss.  HENT:  Negative for congestion, ear discharge and nosebleeds.   Eyes:  Negative for blurred vision.  Respiratory:  Negative for cough, hemoptysis, sputum production, shortness of breath and wheezing.   Cardiovascular:  Negative for chest pain, palpitations, orthopnea and claudication.  Gastrointestinal:  Negative for abdominal pain, blood in stool, constipation, diarrhea, heartburn, melena, nausea and vomiting.  Genitourinary:  Negative for dysuria, flank pain, frequency, hematuria and urgency.  Musculoskeletal:  Negative for back pain, joint pain and myalgias.  Skin:  Negative for rash.  Neurological:  Negative for dizziness, tingling, focal weakness, seizures, weakness and headaches.  Endo/Heme/Allergies:  Does not bruise/bleed easily.  Psychiatric/Behavioral:  Negative for depression and suicidal ideas. The patient does not have insomnia.  Allergies[1]   Past Medical History:  Diagnosis Date   Asthma    Breast cancer (HCC)    right side   Bronchitis     Pulmonary embolism (HCC)      Past Surgical History:  Procedure Laterality Date   BREAST LUMPECTOMY     right side   CHOLECYSTECTOMY     TONSILLECTOMY      Social History   Socioeconomic History   Marital status: Married    Spouse name: Not on file   Number of children: Not on file   Years of education: Not on file   Highest education level: Not on file  Occupational History   Not on file  Tobacco Use   Smoking status: Former   Smokeless tobacco: Never  Vaping Use   Vaping status: Never Used  Substance and Sexual Activity   Alcohol use: No   Drug use: No   Sexual activity: Not Currently  Other Topics Concern   Not on file  Social History Narrative   Not on file   Social Drivers of Health   Tobacco Use: Medium Risk (07/06/2024)   Patient History    Smoking Tobacco Use: Former    Smokeless Tobacco Use: Never    Passive Exposure: Not on Actuary Strain: Low Risk  (12/29/2023)   Received from [facility]   Overall Financial Resource Strain (CARDIA)    Difficulty of Paying Living Expenses: Not hard at all  Food Insecurity: No Food Insecurity (12/29/2023)   Received from [facility]   Epic    Within the past 12 months, you worried that your food would run out before you got the money to buy more.: Never true    Within the past 12 months, the food you bought just didn't last and you didn't have money to get more.: Never true  Transportation Needs: No Transportation Needs (12/29/2023)   Received from [facility] - Transportation    In the past 12 months, has lack of transportation kept you from medical appointments or from getting medications?: No    Lack of Transportation (Non-Medical): No  Physical Activity: Not on file  Stress: Not on file  Social Connections: Not on file  Intimate Partner Violence: Not on file  Depression (PHQ2-9): Low Risk (07/06/2024)   Depression (PHQ2-9)    PHQ-2  Score: 0  Alcohol Screen: Not on file  Housing: Low Risk  (12/29/2023)   Received from [facility]   Epic    In the last 12 months, was there a time when you were not able to pay the mortgage or rent on time?: No    In the past 12 months, how many times have you moved where you were living?: 0    At any time in the past 12 months, were you homeless or living in a shelter (including now)?: No  Utilities: Not At Risk (12/29/2023)   Received from [facility]   Epic    In the past 12 months has the electric, gas, oil, or water company threatened to shut off services in your home?: No  Health Literacy: Not on file    Family History  Problem Relation Age of Onset   Diabetes Mother    Heart disease Father     Current Medications[2]  Physical exam:  Vitals:   07/06/24 1318  BP: 106/69  Pulse: 79  Resp: 18  Temp: ROLLEN)  96.6 F (35.9 C)  TempSrc: Tympanic  SpO2: 100%  Weight: 137 lb 14.4 oz (62.6 kg)  Height: 5' 6 (1.676 m)   Physical Exam Constitutional:      Comments: Ambulates with a walker.  Appears in no acute distress  Cardiovascular:     Rate and Rhythm: Normal rate and regular rhythm.     Heart sounds: Normal heart sounds.  Pulmonary:     Effort: Pulmonary effort is normal.     Breath sounds: Normal breath sounds.  Skin:    General: Skin is warm and dry.  Neurological:     Mental Status: She is alert and oriented to person, place, and time.      I have personally reviewed labs listed below:    Latest Ref Rng & Units 07/06/2024   12:55 PM  CMP  Glucose 70 - 99 mg/dL 868   BUN 8 - 23 mg/dL 25   Creatinine 9.55 - 1.00 mg/dL 8.82   Sodium 864 - 854 mmol/L 138   Potassium 3.5 - 5.1 mmol/L 4.6   Chloride 98 - 111 mmol/L 103   CO2 22 - 32 mmol/L 26   Calcium 8.9 - 10.3 mg/dL 89.6   Total Protein 6.5 - 8.1 g/dL 6.4   Total Bilirubin 0.0 - 1.2 mg/dL 0.2   Alkaline Phos 38 - 126 U/L 92   AST 15 - 41 U/L 22   ALT 0 - 44 U/L 11        Latest Ref Rng & Units 07/06/2024   12:55 PM  CBC  WBC 4.0 - 10.5 K/uL 5.9   Hemoglobin 12.0 - 15.0 g/dL 87.2   Hematocrit 63.9 - 46.0 % 39.1   Platelets 150 - 400 K/uL 440      Assessment and plan- Patient is a 89 y.o. female with history of JAK2 positive myeloproliferative neoplasm polycythemia vera versus essential thrombocytosis currently on Hydrea  here for routine follow-up  Assessment and Plan    Essential thrombocythemia Chronic essential thrombocythemia with stable hematologic parameters on hydroxyurea . Platelet count at 440 x10^9/L is acceptable given age, stability, and absence of complications. Hemoglobin and hematocrit within target ranges. - Continued hydroxyurea  500 mg daily except Sundays. - Monitored hemoglobin, hematocrit, and platelet count. - Discussed that current platelet count is acceptable and no hydroxyurea  dose adjustment is indicated due to increased risk of cytopenias with higher dosing.   - CBC with differential CMP in 3 and 6 months and I will see her back in 6 months     Visit Diagnosis 1. Polycythemia vera (HCC)   2. High risk medication use      Dr. Annah Skene, MD, MPH [facility] [phone] 07/06/2024 4:00 PM                   [1]  Allergies Allergen Reactions   Other Anaphylaxis   Shellfish Allergy Anaphylaxis   Amoxicillin    Codeine    Iodine    Morphine And Codeine   [2]  Current Outpatient Medications:    acetaminophen (TYLENOL) 500 MG tablet, Take by mouth., Disp: , Rfl:    albuterol  (PROVENTIL ) (2.5 MG/3ML) 0.083% nebulizer solution, Take 3 mLs (2.5 mg total) by nebulization every 4 (four) hours as needed for wheezing or shortness of breath., Disp: 75 mL, Rfl: 0   albuterol  (VENTOLIN  HFA) 108 (90 Base) MCG/ACT inhaler, Ventolin  HFA 90 mcg/actuation aerosol inhaler  INHALE 2 INHALATIONS INTO THE LUNGS EVERY 4 HOURS AS  NEEDED FOR WHEEZING OR SHORTNESS OF BREATH, Disp: , Rfl:     ARIPiprazole (ABILIFY) 2 MG tablet, Take 1 mg by mouth., Disp: , Rfl:    aspirin 81 MG EC tablet, Take by mouth., Disp: , Rfl:    desonide (DESOWEN) 0.05 % cream, Apply topically., Disp: , Rfl:    escitalopram (LEXAPRO) 10 MG tablet, Take 10 mg by mouth daily., Disp: , Rfl:    hydroxyurea  (HYDREA ) 500 MG capsule, TAKE 1 CAPSULE BY MOUTH 6 DAYS A WEEK PER ADMIN INSTRUCTIONS. MAY TAKE WITH FOOD TO MINIMIZE GI SIDE EFFECTS, Disp: 51 capsule, Rfl: 3   loratadine (CLARITIN) 10 MG tablet, Take by mouth., Disp: , Rfl:    omeprazole (PRILOSEC) 10 MG capsule, Take 1 capsule by mouth daily., Disp: , Rfl:    pravastatin (PRAVACHOL) 20 MG tablet, Take 1 tablet by mouth at bedtime., Disp: , Rfl:    tiZANidine (ZANAFLEX) 2 MG tablet, Take 2 mg by mouth at bedtime., Disp: , Rfl:    traMADol (ULTRAM) 50 MG tablet, Take 50 mg by mouth., Disp: , Rfl:    clonazePAM (KLONOPIN) 0.25 MG disintegrating tablet, Take by mouth. (Patient not taking: Reported on 12/30/2023), Disp: , Rfl:    predniSONE  (DELTASONE ) 10 MG tablet, Taper:  6 tablets today, 5 tablets tomorrow, 4 tablets next day, 3 tablets next day, 2 tablets next day, 1 tablet on last day (Patient not taking: Reported on 12/30/2023), Disp: , Rfl:   "

## 2024-07-06 NOTE — Progress Notes (Signed)
 Patient doing okay; no new or acute concerns at this time.

## 2024-07-07 NOTE — H&P (View-Only) (Signed)
 "  ORTHOPAEDIC SURGERY- CLINIC NOTE  Chief Complaint: Right elbow cyst  History of Present Illness: History of Present Illness Summer Green is a 89 year old female who presents with a painful mass on the posterior right elbow.  A mass has been present on the posterior aspect of her right elbow for approximately two years. The mass has recently become painful, particularly with direct pressure, such as when sitting. She frequently traumatizes the area due to its location and reports that the mass has become more prominent.  She has not attempted any prior treatments for the right elbow mass and has not received steroid injections to the area.  She denies numbness or tingling in her hands. She has neuropathy and arthritis affecting her feet, but not her hands.  Prior medical records were reviewed.  Patient was recently seen and evaluated by Krystal Doyne, PA.  She complained of a mass in the back of her right elbow.  This has been present for 2 years and is bothersome to the patient.  There is concern for a bursal versus ganglion cyst.   PMHx, PSurgHx, Fam Hx, Soc Hx, Meds, Allergies: Past Medical History:  Diagnosis Date   Aortic atherosclerosis 03/2022   Incidentally noted on chest CT.   B12 deficiency 10/04/2020   Chronic renal insufficiency, stage III (moderate) 10/04/2020   Concussion with loss of consciousness of 30 minutes or less 1935   Coronary atherosclerosis 03/2022   3 vessel CAD incidentally noted on chest CT   Diverticular hemorrhage 07/2017   While on Xarelto; had colonoscopy 08/05/2017 showing 3 polyps and internal hemorrhoids   Finger fracture, right 12/2020   Gastroesophageal reflux 10/04/2020   H/O clavicle fracture 1940   left   History of food allergy 10/04/2020   Cabbage, carrot, peanuts, almonds, peas, salmon oil, shellfish   Kidney injury 1930   Malignant neoplasm of right female breast (CMS/HHS-HCC) 2012   Stage IA right breast cancer in 10/2010  (ER+, PR-, HER2-), treated with lumpectomy and lymph node biopsy, then radiation and anastrozole from 01/2011 - 07/2016   Mixed hyperlipidemia    Moderate persistent asthma without complication (HHS-HCC)    Osteopenia 10/04/2020   Polycythemia vera (CMS/HHS-HCC) 09/05/2020   Pulmonary embolism, bilateral (CMS/HHS-HCC)    Factor V Leiden and prothrombin gene mutation were negative on 09/12/2016. She was on Pradaxa 150 mg BID (08/20/2016 - 02/20/2017) then Xarelto (02/21/2017 - 07/2017). Xarelto was discontinued due to a diverticular bleed. It was felt that her PE may have been provoked by anastrozole.   Pulmonary nodules 03/2022   Scattered solid pulmonary nodules in both lungs, largest 0.8 cm  in the medial right middle lobe. Non-contrast chest CT at 3-6 months  is recommended.   Seasonal allergic rhinitis 10/04/2020   Vitamin D deficiency 10/04/2020    Past Surgical History:  Procedure Laterality Date   TONSILLECTOMY Bilateral 1948   CHOLECYSTECTOMY N/A 1960   APPENDECTOMY N/A 04/29/1996   MASTECTOMY PARTIAL / LUMPECTOMY W/ AXILLARY LYMPHADENECTOMY Right 11/08/2010   CATARACT EXTRACTION Bilateral 2013    Family History  Problem Relation Age of Onset   Diabetes Mother    Heart disease Mother    Heart disease Father    Prostate cancer Father    Alzheimer's disease Father    No Known Problems Maternal Grandfather    Alzheimer's disease Paternal Grandmother    Prostate cancer Paternal Grandfather     Social History   Socioeconomic History   Marital status: Widowed  Spouse name: Clem  Tobacco Use   Smoking status: Former    Current packs/day: 0.00    Types: Cigarettes    Quit date: 1960    Years since quitting: 66.0   Smokeless tobacco: Never  Vaping Use   Vaping status: Never Used  Substance and Sexual Activity   Alcohol use: Not Currently   Drug use: Never   Sexual activity: Not Currently    Partners: Male  Social History Narrative    Married, retired bookkeeper   She and her husband moved from [place]  in 05/2020 to live with their son, daughter-in-law and grandson.           --They live in their own in-law apartment on son's property.      Daughter-in-law is an engineer, civil (consulting).           --Daughter helps manage pt & her husband's meds.   Social Drivers of Corporate Investment Banker Strain: Low Risk  (12/29/2023)   Overall Financial Resource Strain (CARDIA)    Difficulty of Paying Living Expenses: Not hard at all  Food Insecurity: No Food Insecurity (12/29/2023)   Hunger Vital Sign    Worried About Running Out of Food in the Last Year: Never true    Ran Out of Food in the Last Year: Never true  Transportation Needs: No Transportation Needs (12/29/2023)   PRAPARE - Administrator, Civil Service (Medical): No    Lack of Transportation (Non-Medical): No  Housing Stability: Low Risk  (12/29/2023)   Housing Stability Vital Sign    Unable to Pay for Housing in the Last Year: No    Number of Times Moved in the Last Year: 0    Homeless in the Last Year: No     Current Outpatient Medications  Medication Sig Dispense Refill   acetaminophen (TYLENOL) 500 MG tablet Take 500-1,000 mg by mouth every 8 (eight) hours as needed for Pain     albuterol  MDI, PROVENTIL , VENTOLIN , PROAIR , HFA 90 mcg/actuation inhaler INHALE 2 INHALATIONS INTO THE LUNGS FOUR TIMES DAILY 54 g 1   ARIPiprazole (ABILIFY) 2 MG tablet Take 1 mg by mouth once daily     aspirin 81 MG EC tablet Take 81 mg by mouth once daily     clonazePAM (KLONOPIN) 0.25 MG disintegrating tablet DISSOLVE 1 TABLET(0.25 MG) ON THE TONGUE TWICE DAILY AS NEEDED FOR ACUTE ANXIETY 10 tablet 0   escitalopram oxalate (LEXAPRO) 10 MG tablet TAKE 1 TABLET(10 MG) BY MOUTH AT BEDTIME 90 tablet 1   hydroxyurea  (HYDREA ) 500 mg capsule Take 500 mg by mouth as directed Monday, Tuesday, Wednesday,Thursday, Friday, Saturday     loratadine (CLARITIN) 10 mg tablet Take 10 mg by mouth  at bedtime     MULTIVITAMIN ORAL Take 1 tablet by mouth once daily     omeprazole magnesium (PRILOSEC ORAL) Take 20 mg by mouth every morning     pravastatin (PRAVACHOL) 20 MG tablet TAKE 1 TABLET(20 MG) BY MOUTH AT BEDTIME 90 tablet 1   No current facility-administered medications for this visit.    Allergies  Allergen Reactions   Codeine Swelling   Shellfish Derived Anaphylaxis   Amoxicillin Rash   Opioids - Morphine Analogues Itching   Phenylhistine Dh [Chlorphen-Pseudoephed-Codeine] Other (See Comments)    Hypotension    Review of Systems: A 10+ ROS was performed, reviewed, and the pertinent orthopaedic findings are documented in the HPI.  I have reviewed and agree with the ROS captured by the  CMA.    Physical Exam: Ht 167.6 cm (5' 5.98)   Wt 62.8 kg (138 lb 6.4 oz)   BMI 22.35 kg/m  General/Constitutional: No apparent distress: well-nourished and well developed. Eyes: Pupils equal, round with synchronous movement. Lymphatic: No palpable adenopathy. Respiratory: Patient has good chest rise and fall with inspiration and expiration.  All lung fields are clear to auscultation bilaterally.  There is no Rales, rhonchi or wheezes appreciated. Cardiovascular: Upon auscultation there is a regular rate and rhythm without any murmurs, rubs, gallops or heaves appreciated. Integumentary: No impressive skin lesions present, except as noted in detailed exam. Neuro/Psych: Normal mood and affect, oriented to person, place and time. Musculoskeletal: see exam below  Right Upper Extremity: Cystic mass over the tip of the olecranon.  It is fairly firm but compressible and mobile underneath the skin.  Part of it feels somewhat adhesed to the underlying skin.  She has full flexion and extension of the elbow.  Full pronosupination of the forearm.  Able to make a full composite fist and extend all of her digits.  Sensations intact to light touch over her fingers.  The hand is warm and  well-perfused. Imaging and Results: Radiographs of the right elbow were obtained in clinic today and personally interpreted.  There is a large calcification over the tip of the olecranon likely associated with the olecranon bursa.  There is mild arthritic changes but overall the joint spaces fairly well preserved.  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  Discussed the risk, benefits, alternatives to surgical intervention.  Due to the chronicity of this issue and the pain/discomfort that is causing, the patient would like to move forward with surgical excision. - Scheduled outpatient surgical excision. - Reviewed risks: infection, injury to adjacent structures, wound healing issues, recurrence, stiffness - Discussed steroid injection as alternative, unlikely to resolve cyst or relieve pain, may increase infection risk if surgery follows soon. - The patient and family are not final on their decision for surgery but would like to go ahead with scheduling and let us  know soon if they decide to cancel - Patient will follow-up postoperatively  Jackquline CANDIE Barrack, MD [facility] [address] [place], [place] [address] Phone: [phone]  This note was generated in part with voice recognition software; please excuse any typographical errors that were not detected and corrected. This note has been created using automated tools and reviewed for accuracy by [facility].  "

## 2024-07-18 ENCOUNTER — Other Ambulatory Visit: Payer: Self-pay

## 2024-08-02 ENCOUNTER — Encounter: Admitting: Anesthesiology

## 2024-08-02 ENCOUNTER — Encounter: Admission: RE | Disposition: A | Discharge status: Home / Self Care | Payer: Self-pay | Source: Home / Self Care

## 2024-08-02 ENCOUNTER — Ambulatory Visit: Admission: RE | Admit: 2024-08-02 | Discharge: 2024-08-02 | Disposition: A

## 2024-08-02 ENCOUNTER — Other Ambulatory Visit: Payer: Self-pay

## 2024-08-02 DIAGNOSIS — G629 Polyneuropathy, unspecified: Secondary | ICD-10-CM | POA: Insufficient documentation

## 2024-08-02 DIAGNOSIS — J4489 Other specified chronic obstructive pulmonary disease: Secondary | ICD-10-CM | POA: Insufficient documentation

## 2024-08-02 DIAGNOSIS — M71321 Other bursal cyst, right elbow: Secondary | ICD-10-CM | POA: Insufficient documentation

## 2024-08-02 DIAGNOSIS — M19072 Primary osteoarthritis, left ankle and foot: Secondary | ICD-10-CM | POA: Insufficient documentation

## 2024-08-02 DIAGNOSIS — M19071 Primary osteoarthritis, right ankle and foot: Secondary | ICD-10-CM | POA: Insufficient documentation

## 2024-08-02 DIAGNOSIS — N183 Chronic kidney disease, stage 3 unspecified: Secondary | ICD-10-CM | POA: Insufficient documentation

## 2024-08-02 DIAGNOSIS — Z87891 Personal history of nicotine dependence: Secondary | ICD-10-CM | POA: Insufficient documentation

## 2024-08-02 HISTORY — DX: Chronic kidney disease, unspecified: N18.9

## 2024-08-02 HISTORY — DX: Polycythemia vera: D45

## 2024-08-02 HISTORY — DX: Unspecified osteoarthritis, unspecified site: M19.90

## 2024-08-02 HISTORY — DX: Gastro-esophageal reflux disease without esophagitis: K21.9

## 2024-08-02 HISTORY — DX: Chronic obstructive pulmonary disease, unspecified: J44.9

## 2024-08-02 MED ORDER — LIDOCAINE HCL (CARDIAC) PF 100 MG/5ML IV SOSY
PREFILLED_SYRINGE | INTRAVENOUS | Status: DC | PRN
  Administered 2024-08-02: 50 mg via INTRAVENOUS

## 2024-08-02 MED ORDER — FENTANYL CITRATE (PF) 100 MCG/2ML IJ SOLN
INTRAMUSCULAR | Status: AC
  Filled 2024-08-02: qty 2

## 2024-08-02 MED ORDER — CEFAZOLIN SODIUM-DEXTROSE 2-4 GM/100ML-% IV SOLN
2.0000 g | INTRAVENOUS | Status: AC
  Administered 2024-08-02: 2 g via INTRAVENOUS

## 2024-08-02 MED ORDER — PROPOFOL 1000 MG/100ML IV EMUL
INTRAVENOUS | Status: AC
  Filled 2024-08-02: qty 100

## 2024-08-02 MED ORDER — ONDANSETRON HCL 4 MG/2ML IJ SOLN
4.0000 mg | Freq: Once | INTRAMUSCULAR | Status: AC
  Administered 2024-08-02: 4 mg via INTRAVENOUS

## 2024-08-02 MED ORDER — CEFAZOLIN SODIUM-DEXTROSE 2-3 GM-%(50ML) IV SOLR
INTRAVENOUS | Status: AC
  Filled 2024-08-02: qty 50

## 2024-08-02 MED ORDER — OXYCODONE HCL 5 MG PO TABS: 5.0000 mg | ORAL_TABLET | ORAL | 0 refills | Status: AC | PRN

## 2024-08-02 MED ORDER — OXYCODONE HCL 5 MG PO TABS: 5.0000 mg | ORAL_TABLET | Freq: Once | ORAL | Status: DC | PRN

## 2024-08-02 MED ORDER — BUPIVACAINE HCL (PF) 0.5 % IJ SOLN
INTRAMUSCULAR | Status: AC
  Filled 2024-08-02: qty 10

## 2024-08-02 MED ORDER — PROPOFOL 500 MG/50ML IV EMUL
INTRAVENOUS | Status: DC | PRN
  Administered 2024-08-02: 70 ug/kg/min via INTRAVENOUS

## 2024-08-02 MED ORDER — BUPIVACAINE LIPOSOME 1.3 % IJ SUSP
INTRAMUSCULAR | Status: DC | PRN
  Administered 2024-08-02: 10 mL

## 2024-08-02 MED ORDER — DEXAMETHASONE SOD PHOSPHATE PF 10 MG/ML IJ SOLN
INTRAMUSCULAR | Status: DC | PRN
  Administered 2024-08-02: 4 mg via INTRAVENOUS

## 2024-08-02 MED ORDER — OXYCODONE HCL 5 MG/5ML PO SOLN: 5.0000 mg | Freq: Once | ORAL | Status: DC | PRN

## 2024-08-02 MED ORDER — SODIUM CHLORIDE 0.9 % IV SOLN: INTRAVENOUS | Status: DC | PRN

## 2024-08-02 MED ORDER — LACTATED RINGERS IV SOLN: INTRAVENOUS | Status: DC

## 2024-08-02 MED ORDER — ONDANSETRON HCL 4 MG/2ML IJ SOLN
INTRAMUSCULAR | Status: AC
  Filled 2024-08-02: qty 2

## 2024-08-02 MED ORDER — BUPIVACAINE LIPOSOME 1.3 % IJ SUSP
INTRAMUSCULAR | Status: AC
  Filled 2024-08-02: qty 10

## 2024-08-02 MED ORDER — 0.9 % SODIUM CHLORIDE (POUR BTL) OPTIME
TOPICAL | Status: DC | PRN
  Administered 2024-08-02: 500 mL

## 2024-08-02 MED ORDER — BUPIVACAINE HCL (PF) 0.5 % IJ SOLN
INTRAMUSCULAR | Status: DC | PRN
  Administered 2024-08-02: 15 mL

## 2024-08-02 MED ORDER — EPHEDRINE SULFATE (PRESSORS) 25 MG/5ML IV SOSY
PREFILLED_SYRINGE | INTRAVENOUS | Status: DC | PRN
  Administered 2024-08-02: 5 mg via INTRAVENOUS

## 2024-08-02 MED ORDER — FENTANYL CITRATE (PF) 100 MCG/2ML IJ SOLN
INTRAMUSCULAR | Status: DC | PRN
  Administered 2024-08-02: 100 ug via INTRAVENOUS

## 2024-08-02 NOTE — Progress Notes (Signed)
 Assisted Dr. Kradel with right, supraclavicular, ultrasound guided block. Side rails up, monitors on throughout procedure. See vital signs in flow sheet. Tolerated Procedure well.

## 2024-08-02 NOTE — Anesthesia Postprocedure Evaluation (Signed)
"   Anesthesia Post Note  Patient: Summer Green  Procedure(s) Performed: BURSECTOMY, ELBOW (Right: Elbow)  Anesthesia Type: Regional Anesthetic complications: no   No notable events documented.   Last Vitals:  Vitals:   08/02/24 1219 08/02/24 1230  BP: 136/88   Pulse: 75 74  Resp: 15 14  Temp: (!) 36.2 C   SpO2: 91% 94%    Last Pain:  Vitals:   08/02/24 1219  TempSrc:   PainSc: Asleep                 Redell MARLA Breaker      "

## 2024-08-02 NOTE — Op Note (Signed)
 Date of procedure:  08/02/2024  Surgeon:  Jackquline CANDIE Barrack, MD   Procedure(s) performed:   Procedures: BURSECTOMY, ELBOW (Right) (CPT [phone])  Assistant(s): none   Preoperative diagnosis:   Other bursal cyst, right elbow M71.321    Postoperative diagnosis:   Other bursal cyst, right elbow    Procedure findings: Chalky mass found subcutaneously.  Could be consistent with gouty bursitis versus epidermal inclusion cyst.  Anesthesia:  Regional + MAC   Estimated blood loss:  Minimal   Specimen: Right elbow mass   Complications:  None apparent   Implants: None   Indications: 89 y.o. year old female with right elbow olecranon bursitis/cyst.  She had failed extensive conservative management in regards to this.  We discussed operative treatment and explained the risks, benefits, and alternatives as well as the expected postoperative course.  The patient elected to proceed with surgery.   Procedure in detail:  The patient was met in the pre-op holding area and the right upper extremity was marked and the consent confirmed.  Anesthesia placed a short acting regional block.  The patient was then taken to the operating room and the hand table was attached to the stretcher.  Antibiotics were administered prior to incision.  All bony prominences were well padded.  A tourniquet was placed high on the operative extremity. the operative extremity was then prepped and draped in the usual sterile fashion.  A timeout was performed confirming the correct patient, site, and procedure.   An Esmarch was placed around the extremity and the tourniquet was insufflated to 250 mmHg.  An incision was placed on the posterior aspect of the olecranon directly over the mass.  An incision was made and I quickly encountered a chalky substance subcutaneously.  The mass was closely adhered to the skin and the surrounding soft tissue was dissected away from the mass.  The mass was then removed and given off as a specimen to  be sent to pathology.  The tourniquet was then let down and the wound was thoroughly irrigated ensuring all chalky substance was removed.   The incision was closed with 3-0 Vicryl and 4-0 Nylon.  Sterile dressings were applied.  The patient was transferred to the PACU in stable condition.  Counts were correct x2.

## 2024-08-02 NOTE — Discharge Instructions (Signed)
 Discharge Instructions After Surgery   Diet   Return to your regular diet as tolerated.   Dressings   You may remove your dressings after 3-4 days.  Okay to shower at this point but do not soak in water (baths, swimming, etc).  It is okay to have a small spot of blood on your dressing as long as it does not keep getting bigger.   In the first 3-4 days, cover the dressing and keep it dry in the shower or bath. Keep your arm up and out of the water.   Do not put any ointments, creams, alcohol , or hydrogen peroxide on your incisions.  Activity   If your fingers are free, work on making a full fist and straightening out the fingers within the limits of your dressing.  Move other joints such as your elbow or shoulder multiple times a day to prevent getting stiff.  No lifting, carrying, pushing, or pulling with your arm until your follow-up visit. You may use your hand or fingers for simple things like writing, typing, eating, and brushing your teeth.   If you were given a sling, wear it while your hand or arm is numb. You can stop wearing the sling when you have feeling and strength back in your hand or arm. You may also use the sling as needed to elevate, protect, or support your arm until your follow-up visit.   Pain   The first 48 hours after surgery can hurt the most. You should use the different types of pain medicine along with rest and elevation to help keep the pain manageable.   NSAIDS such as ibuprofen (Advil or Motrin) or naproxen  (Aleve ) can help with pain and swelling.   Acetaminophen  (Tylenol ) can be used with NSAIDs. Do not take more than 3000 mg of Tylenol  in a 24-hour period.   Narcotic pain medicine such as hydrocodone  or oxycodone , should be used as prescribed. Stop taking as soon as possible. Take with food to help prevent nausea. Do not drink alcohol  or drive while taking narcotics. Be aware that hydrocodone  tablets often already contain acetaminophen  (Tylenol ).   Elevating your operative extremity above the level of your heart will help with pain and swelling. You can rest your arm on several pillows for support while sleeping or resting.   Constipation   Narcotic pain medicines, changes in eating and drinking, and less activity may cause constipation after surgery.   Over-the-counter stool softeners like docusate (Colace), Senna, or Miralax can help. Follow the directions on the bottle.   Stay hydrated and try to eat high fiber foods.   When to call your surgeon   If your dressing gets wet or very dirty.   Fever more than 101 F.   Incision that is very red, swollen, draining pus, or feels hot.   Severe pain, not getting better with pain medicine.   Severe swelling, even with elevation.   If your fingers become cool, cold, or dark in color.   Bleeding that is getting bigger or soaking through the dressing.   Severe nausea and vomiting.   Problems using the bathroom or no bowel movement for 2-3 days.   For questions or concerns, please call [phone].  Jackquline CANDIE Barrack, MD Orthopaedic Surgery [facility]

## 2024-08-02 NOTE — Anesthesia Procedure Notes (Signed)
 Anesthesia Regional Block: Supraclavicular block   Pre-Anesthetic Checklist: , timeout performed,  Correct Patient, Correct Site, Correct Laterality,  Correct Procedure, Correct Position, site marked,  Risks and benefits discussed,  Surgical consent,  Pre-op evaluation,  At surgeon's request and post-op pain management  Laterality: Right and Upper  Prep: chloraprep       Needles:  Injection technique: Single-shot  Needle Type: Echogenic Needle     Needle Length: 10cm  Needle Gauge: 21     Additional Needles:   Procedures:,,,, ultrasound used (permanent image in chart),,    Narrative:  Start time: 08/02/2024 9:33 AM End time: 08/02/2024 9:37 AM Injection made incrementally with aspirations every 5 mL.  Performed by: Personally

## 2024-08-02 NOTE — Anesthesia Preprocedure Evaluation (Addendum)
"                                    Anesthesia Evaluation  Patient identified by MRN, date of birth, ID band Patient awake    Reviewed: Allergy & Precautions, H&P , NPO status , Patient's Chart, lab work & pertinent test results  Airway Mallampati: II  TM Distance: >3 FB Neck ROM: Full    Dental no notable dental hx.    Pulmonary asthma , COPD, former smoker   Pulmonary exam normal breath sounds clear to auscultation       Cardiovascular negative cardio ROS Normal cardiovascular exam Rhythm:Regular Rate:Normal     Neuro/Psych negative neurological ROS  negative psych ROS   GI/Hepatic Neg liver ROS,GERD  ,,  Endo/Other  negative endocrine ROS    Renal/GU Renal disease  negative genitourinary   Musculoskeletal negative musculoskeletal ROS (+)    Abdominal   Peds negative pediatric ROS (+)  Hematology negative hematology ROS (+)   Anesthesia Other Findings   Reproductive/Obstetrics negative OB ROS                              Anesthesia Physical Anesthesia Plan  ASA: 3  Anesthesia Plan: MAC, General and Regional   Post-op Pain Management: Regional block and Minimal or no pain anticipated   Induction: Intravenous  PONV Risk Score and Plan: 1  Airway Management Planned:   Additional Equipment:   Intra-op Plan:   Post-operative Plan: Extubation in OR  Informed Consent: I have reviewed the patients History and Physical, chart, labs and discussed the procedure including the risks, benefits and alternatives for the proposed anesthesia with the patient or authorized representative who has indicated his/her understanding and acceptance.     Dental advisory given  Plan Discussed with: CRNA  Anesthesia Plan Comments:         Anesthesia Quick Evaluation  "

## 2024-08-04 LAB — SURGICAL PATHOLOGY

## 2024-10-12 ENCOUNTER — Inpatient Hospital Stay

## 2025-01-04 ENCOUNTER — Inpatient Hospital Stay

## 2025-01-04 ENCOUNTER — Inpatient Hospital Stay: Admitting: Oncology
